# Patient Record
Sex: Female | Born: 1990 | Race: Black or African American | Hispanic: No | Marital: Single | Smoking: Never smoker
Health system: Southern US, Community
[De-identification: ages and names within clinical notes are randomized; demographics above are authoritative.]

## PROBLEM LIST (undated history)

## (undated) DIAGNOSIS — M1990 Unspecified osteoarthritis, unspecified site: Secondary | ICD-10-CM

## (undated) DIAGNOSIS — D649 Anemia, unspecified: Secondary | ICD-10-CM

## (undated) DIAGNOSIS — M459 Ankylosing spondylitis of unspecified sites in spine: Secondary | ICD-10-CM

## (undated) HISTORY — DX: Ankylosing spondylitis of unspecified sites in spine: M45.9

## (undated) HISTORY — DX: Anemia, unspecified: D64.9

## (undated) HISTORY — PX: GLAUCOMA SURGERY: SHX656

---

## 2016-01-07 ENCOUNTER — Emergency Department: Admit: 2016-01-07 | Payer: PRIVATE HEALTH INSURANCE | Primary: Family Medicine

## 2016-01-07 ENCOUNTER — Inpatient Hospital Stay
Admit: 2016-01-07 | Discharge: 2016-01-07 | Disposition: A | Discharge status: Home / Self Care | Payer: PRIVATE HEALTH INSURANCE | Attending: Emergency Medicine

## 2016-01-07 DIAGNOSIS — S348XXA Injury of other nerves at abdomen, lower back and pelvis level, initial encounter: Secondary | ICD-10-CM

## 2016-01-07 LAB — METABOLIC PANEL, COMPREHENSIVE
A-G Ratio: 0.9 — ABNORMAL LOW (ref 1.0–3.1)
ALT (SGPT): 29 U/L (ref 12.0–78.0)
AST (SGOT): 17 U/L (ref 15–37)
Albumin: 3.6 g/dL (ref 3.40–5.00)
Alk. phosphatase: 34 U/L — ABNORMAL LOW (ref 46–116)
Anion gap: 11 mmol/L (ref 10–17)
BUN/Creatinine ratio: 18 (ref 6.0–20.0)
BUN: 16 MG/DL (ref 7–18)
Bilirubin, total: 0.7 MG/DL (ref 0.20–1.00)
CO2: 27 mmol/L (ref 21–32)
Calcium: 9 MG/DL (ref 8.5–10.1)
Chloride: 101 mmol/L (ref 98–107)
Creatinine: 0.9 MG/DL (ref 0.6–1.3)
GFR est AA: >60 mL/min/{1.73_m2} (ref >60)
GFR est non-AA: >60 mL/min/{1.73_m2} (ref >60)
Globulin: 3.9 g/dL
Glucose: 80 mg/dL (ref 74–106)
Potassium: 3.9 mmol/L (ref 3.50–5.10)
Protein, total: 7.5 g/dL (ref 6.40–8.20)
Sodium: 135 mmol/L — ABNORMAL LOW (ref 136–145)

## 2016-01-07 LAB — CBC WITH AUTOMATED DIFF
ABS. BASOPHILS: 0 10*3/uL (ref 0.0–0.2)
ABS. EOSINOPHILS: 0.1 10*3/uL (ref 0.0–0.7)
ABS. LYMPHOCYTES: 1.7 10*3/uL (ref 1.2–3.4)
ABS. MONOCYTES: 1.3 10*3/uL (ref 1.1–3.2)
ABS. NEUTROPHILS: 11.6 10*3/uL — ABNORMAL HIGH (ref 1.4–6.5)
BASOPHILS: 0 % (ref 0–2)
EOSINOPHILS: 1 % (ref 0–5)
HCT: 36.6 % (ref 34.0–42.2)
HGB: 12 g/dL (ref 11.8–14.2)
IMMATURE GRANULOCYTES: 1.1 % (ref 0.0–5.0)
LYMPHOCYTES: 12 % — ABNORMAL LOW (ref 16–40)
MCH: 26.6 PG — ABNORMAL LOW (ref 27–31)
MCHC: 32.8 g/dL (ref 32–36)
MCV: 81.2 FL (ref 81–99)
MONOCYTES: 9 % (ref 0–12)
MPV: 9.4 FL (ref 7.4–10.4)
NEUTROPHILS: 78 % — ABNORMAL HIGH (ref 40–70)
PLATELET: 193 10*3/uL (ref 140–450)
RBC: 4.51 M/uL (ref 4.0–5.2)
RDW: 17.9 % — ABNORMAL HIGH (ref 11.5–14.5)
WBC: 14.9 10*3/uL — ABNORMAL HIGH (ref 4.8–10.8)

## 2016-01-07 LAB — LACTIC ACID: Lactic acid: 2.3 MMOL/L — ABNORMAL HIGH (ref 0.4–2.0)

## 2016-01-07 LAB — HCG QL SERUM: HCG, Ql.: NEGATIVE

## 2016-01-07 LAB — ETHYL ALCOHOL: ALCOHOL(ETHYL),SERUM: <3 MG/DL (ref <3.0)

## 2016-01-07 MED ORDER — LIDOCAINE 5 % (700 MG/PATCH) ADHESIVE PATCH
5 % | CUTANEOUS | 0 refills | Status: AC
Start: 2016-01-07 — End: ?

## 2016-01-07 MED ORDER — IOPAMIDOL 61 % IV SOLN
300 mg iodine /mL (61 %) | Freq: Once | INTRAVENOUS | Status: AC
Start: 2016-01-07 — End: 2016-01-07
  Administered 2016-01-07: 11:00:00 via INTRAVENOUS

## 2016-01-07 MED ORDER — ONDANSETRON (PF) 4 MG/2 ML INJECTION
4 mg/2 mL | INTRAMUSCULAR | Status: AC
Start: 2016-01-07 — End: 2016-01-07
  Administered 2016-01-07: 16:00:00 via INTRAVENOUS

## 2016-01-07 MED ORDER — OXYCODONE ER 10 MG TABLET,CRUSH RESISTANT,EXTENDED RELEASE 12 HR
10 mg | Freq: Once | ORAL | Status: AC
Start: 2016-01-07 — End: 2016-01-07
  Administered 2016-01-07: 14:00:00 via ORAL

## 2016-01-07 MED ORDER — DEXAMETHASONE SODIUM PHOSPHATE 10 MG/ML IJ SOLN
10 mg/mL | INTRAMUSCULAR | Status: AC
Start: 2016-01-07 — End: 2016-01-07
  Administered 2016-01-07: 14:00:00 via INTRAVENOUS

## 2016-01-07 MED ORDER — KETOROLAC TROMETHAMINE 30 MG/ML INJECTION
30 mg/mL (1 mL) | INTRAMUSCULAR | Status: AC
Start: 2016-01-07 — End: 2016-01-07
  Administered 2016-01-07: 12:00:00 via INTRAVENOUS

## 2016-01-07 MED ORDER — IBUPROFEN 800 MG TAB
800 mg | ORAL_TABLET | Freq: Three times a day (TID) | ORAL | 0 refills | Status: AC | PRN
Start: 2016-01-07 — End: 2016-01-14

## 2016-01-07 MED ORDER — SODIUM CHLORIDE 0.9% BOLUS IV
0.9 % | INTRAVENOUS | Status: AC
Start: 2016-01-07 — End: 2016-01-07
  Administered 2016-01-07: 16:00:00 via INTRAVENOUS

## 2016-01-07 MED ORDER — LIDOCAINE 5 % (700 MG/PATCH) ADHESIVE PATCH
5 % | CUTANEOUS | Status: DC
Start: 2016-01-07 — End: 2016-01-07

## 2016-01-07 MED ORDER — CYCLOBENZAPRINE 10 MG TAB
10 mg | ORAL | Status: AC
Start: 2016-01-07 — End: 2016-01-07
  Administered 2016-01-07: 14:00:00 via ORAL

## 2016-01-07 MED ORDER — MORPHINE 4 MG/ML SYRINGE
4 mg/mL | INTRAMUSCULAR | Status: DC | PRN
Start: 2016-01-07 — End: 2016-01-07

## 2016-01-07 MED ORDER — DIAZEPAM 5 MG/ML SYRINGE
5 mg/mL | INTRAMUSCULAR | Status: AC
Start: 2016-01-07 — End: 2016-01-07
  Administered 2016-01-07: 12:00:00 via INTRAVENOUS

## 2016-01-07 MED ORDER — SODIUM CHLORIDE 0.9% BOLUS IV
0.9 % | Freq: Once | INTRAVENOUS | Status: AC
Start: 2016-01-07 — End: 2016-01-07
  Administered 2016-01-07: 10:00:00 via INTRAVENOUS

## 2016-01-07 MED ORDER — CYCLOBENZAPRINE 10 MG TAB
10 mg | ORAL_TABLET | Freq: Two times a day (BID) | ORAL | 0 refills | Status: AC
Start: 2016-01-07 — End: ?

## 2016-01-07 MED ORDER — MORPHINE 4 MG/ML SYRINGE
4 mg/mL | Freq: Once | INTRAMUSCULAR | Status: AC
Start: 2016-01-07 — End: 2016-01-07
  Administered 2016-01-07: 12:00:00 via INTRAVENOUS

## 2016-01-07 MED ORDER — MORPHINE 4 MG/ML SYRINGE
4 mg/mL | Freq: Once | INTRAMUSCULAR | Status: AC
Start: 2016-01-07 — End: 2016-01-07
  Administered 2016-01-07: 10:00:00 via INTRAVENOUS

## 2016-01-07 MED ORDER — MIDAZOLAM 1 MG/ML IJ SOLN
1 mg/mL | INTRAMUSCULAR | Status: AC
Start: 2016-01-07 — End: 2016-01-07
  Administered 2016-01-07: 10:00:00 via INTRAVENOUS

## 2016-01-07 MED ORDER — ONDANSETRON 4 MG TAB, RAPID DISSOLVE
4 mg | ORAL_TABLET | Freq: Three times a day (TID) | ORAL | 0 refills | Status: AC | PRN
Start: 2016-01-07 — End: ?

## 2016-01-07 MED FILL — SODIUM CHLORIDE 0.9 % IV: INTRAVENOUS | Qty: 1000

## 2016-01-07 MED FILL — DIAZEPAM 5 MG/ML SYRINGE: 5 mg/mL | INTRAMUSCULAR | Qty: 2

## 2016-01-07 MED FILL — MIDAZOLAM 1 MG/ML IJ SOLN: 1 mg/mL | INTRAMUSCULAR | Qty: 2

## 2016-01-07 MED FILL — KETOROLAC TROMETHAMINE 30 MG/ML INJECTION: 30 mg/mL (1 mL) | INTRAMUSCULAR | Qty: 1

## 2016-01-07 MED FILL — CYCLOBENZAPRINE 10 MG TAB: 10 mg | ORAL | Qty: 1

## 2016-01-07 MED FILL — ISOVUE-300  61 % INTRAVENOUS SOLUTION: 300 mg iodine /mL (61 %) | INTRAVENOUS | Qty: 100

## 2016-01-07 MED FILL — LIDOCAINE 5 % (700 MG/PATCH) ADHESIVE PATCH: 5 % | CUTANEOUS | Qty: 1

## 2016-01-07 MED FILL — DEXAMETHASONE SODIUM PHOSPHATE 10 MG/ML IJ SOLN: 10 mg/mL | INTRAMUSCULAR | Qty: 1

## 2016-01-07 MED FILL — MORPHINE 4 MG/ML SYRINGE: 4 mg/mL | INTRAMUSCULAR | Qty: 2

## 2016-01-07 MED FILL — ONDANSETRON (PF) 4 MG/2 ML INJECTION: 4 mg/2 mL | INTRAMUSCULAR | Qty: 2

## 2016-01-07 MED FILL — OXYCONTIN 10 MG TABLET,CRUSH RESISTANT,EXTENDED RELEASE: 10 mg | ORAL | Qty: 1

## 2016-01-07 NOTE — ED Notes (Signed)
Called pharmacy for pain medication tablet.

## 2016-01-07 NOTE — ED Notes (Signed)
Bedside shift change report given to Diamond NickelLaura G. RN  (oncoming nurse) by ET (offgoing nurse). Report included the following information SBAR, ED Summary, MAR and Recent Results.

## 2016-01-07 NOTE — ED Notes (Signed)
Pt discharged home with instructions in hand and left with family memebers

## 2016-01-07 NOTE — ED Notes (Signed)
Called PT to alert to order.

## 2016-01-07 NOTE — ED Notes (Signed)
Assumed care of pt and pt tearful at this time and complaining of pain in her back. Dr Roxan Hockeyobinson made aware. Pt has a c-collar in place and awaiting CT results.
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
## 2016-01-07 NOTE — ED Notes (Shared)
7:39 AM  Pt is crying with continued back pain. Sensation intact.    8:58 AM  Pt is moving her legs. She states that the toradol has helped with the pain.    9:37 AM  Pt was given an oxycodone for her pain.    10:13 AM  Pt is tearful and crying. At this time pt C-collar was removed. Will attempt to set pt up with physical therapy. Pt also to be given decadron and a lidoderm patch.    11:51 AM  The pt is throwing up. I will write her for zofran.    12:01 PM  Pt still complains that her legs are weak. I will order an MRI of her spine.    12:49 PM  Pt was able to tolerate crackers. Pt also able to stand and move to chair. Will continue to monitor pt.    1:10 PM  Pt continuing to be tolerating PO. She will be discharged.

## 2020-07-23 ENCOUNTER — Encounter (HOSPITAL_COMMUNITY): Payer: Self-pay | Admitting: *Deleted

## 2020-07-23 ENCOUNTER — Emergency Department (HOSPITAL_COMMUNITY): Payer: Medicaid - Out of State

## 2020-07-23 ENCOUNTER — Emergency Department (HOSPITAL_COMMUNITY)
Admission: EM | Admit: 2020-07-23 | Discharge: 2020-07-23 | Disposition: A | Discharge status: Home / Self Care | Payer: Medicaid - Out of State | Attending: Emergency Medicine | Admitting: Emergency Medicine

## 2020-07-23 ENCOUNTER — Other Ambulatory Visit: Payer: Self-pay

## 2020-07-23 DIAGNOSIS — S0990XA Unspecified injury of head, initial encounter: Secondary | ICD-10-CM | POA: Diagnosis not present

## 2020-07-23 DIAGNOSIS — W08XXXA Fall from other furniture, initial encounter: Secondary | ICD-10-CM | POA: Diagnosis not present

## 2020-07-23 DIAGNOSIS — F17200 Nicotine dependence, unspecified, uncomplicated: Secondary | ICD-10-CM | POA: Insufficient documentation

## 2020-07-23 DIAGNOSIS — S4991XA Unspecified injury of right shoulder and upper arm, initial encounter: Secondary | ICD-10-CM | POA: Insufficient documentation

## 2020-07-23 DIAGNOSIS — S3991XA Unspecified injury of abdomen, initial encounter: Secondary | ICD-10-CM | POA: Diagnosis not present

## 2020-07-23 DIAGNOSIS — W19XXXA Unspecified fall, initial encounter: Secondary | ICD-10-CM

## 2020-07-23 HISTORY — DX: Unspecified osteoarthritis, unspecified site: M19.90

## 2020-07-23 LAB — CBC
HCT: 42 % (ref 36.0–46.0)
Hemoglobin: 13.6 g/dL (ref 12.0–15.0)
MCH: 30 pg (ref 26.0–34.0)
MCHC: 32.4 g/dL (ref 30.0–36.0)
MCV: 92.5 fL (ref 80.0–100.0)
Platelets: 187 10*3/uL (ref 150–400)
RBC: 4.54 MIL/uL (ref 3.87–5.11)
RDW: 14.3 % (ref 11.5–15.5)
WBC: 13 10*3/uL — ABNORMAL HIGH (ref 4.0–10.5)
nRBC: 0 % (ref 0.0–0.2)

## 2020-07-23 LAB — COMPREHENSIVE METABOLIC PANEL
ALT: 39 U/L (ref 0–44)
AST: 36 U/L (ref 15–41)
Albumin: 4.1 g/dL (ref 3.5–5.0)
Alkaline Phosphatase: 47 U/L (ref 38–126)
Anion gap: 12 (ref 5–15)
BUN: 12 mg/dL (ref 6–20)
CO2: 19 mmol/L — ABNORMAL LOW (ref 22–32)
Calcium: 9.6 mg/dL (ref 8.9–10.3)
Chloride: 107 mmol/L (ref 98–111)
Creatinine, Ser: 0.87 mg/dL (ref 0.44–1.00)
GFR, Estimated: >60 mL/min (ref >60)
Glucose, Bld: 96 mg/dL (ref 70–99)
Potassium: 4 mmol/L (ref 3.5–5.1)
Sodium: 138 mmol/L (ref 135–145)
Total Bilirubin: 0.9 mg/dL (ref 0.3–1.2)
Total Protein: 7.4 g/dL (ref 6.5–8.1)

## 2020-07-23 LAB — PROTIME-INR
INR: 1.1 (ref 0.8–1.2)
Prothrombin Time: 13.7 seconds (ref 11.4–15.2)

## 2020-07-23 LAB — ETHANOL: Alcohol, Ethyl (B): <10 mg/dL (ref <10)

## 2020-07-23 LAB — LACTIC ACID, PLASMA: Lactic Acid, Venous: 1.7 mmol/L (ref 0.5–1.9)

## 2020-07-23 LAB — I-STAT BETA HCG BLOOD, ED (MC, WL, AP ONLY): I-stat hCG, quantitative: <5 m[IU]/mL (ref <5)

## 2020-07-23 MED ORDER — SODIUM CHLORIDE 0.9 % IV BOLUS
1000.0000 mL | Freq: Once | INTRAVENOUS | Status: AC
  Administered 2020-07-23: 1000 mL via INTRAVENOUS

## 2020-07-23 MED ORDER — IOHEXOL 300 MG/ML  SOLN
100.0000 mL | Freq: Once | INTRAMUSCULAR | Status: AC | PRN
  Administered 2020-07-23: 100 mL via INTRAVENOUS

## 2020-07-23 MED ORDER — ACETAMINOPHEN 325 MG PO TABS
650.0000 mg | ORAL_TABLET | Freq: Once | ORAL | Status: AC
  Administered 2020-07-23: 650 mg via ORAL
  Filled 2020-07-23: qty 2

## 2020-07-23 MED ORDER — SODIUM CHLORIDE 0.9 % IV SOLN: INTRAVENOUS | Status: DC

## 2020-07-23 MED ORDER — FENTANYL CITRATE (PF) 100 MCG/2ML IJ SOLN
50.0000 ug | Freq: Once | INTRAMUSCULAR | Status: AC
  Administered 2020-07-23: 50 ug via INTRAVENOUS
  Filled 2020-07-23: qty 2
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
## 2020-07-23 NOTE — ED Notes (Signed)
Long spine board removed per Dr. Jacqulyn Bath.
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
## 2020-11-16 ENCOUNTER — Other Ambulatory Visit: Payer: Self-pay

## 2020-11-16 ENCOUNTER — Emergency Department
Admission: EM | Admit: 2020-11-16 | Discharge: 2020-11-16 | Disposition: A | Discharge status: Home / Self Care | Payer: Medicaid - Out of State | Attending: Emergency Medicine | Admitting: Emergency Medicine

## 2020-11-16 ENCOUNTER — Encounter: Payer: Self-pay | Admitting: Emergency Medicine

## 2020-11-16 DIAGNOSIS — N760 Acute vaginitis: Secondary | ICD-10-CM

## 2020-11-16 DIAGNOSIS — N898 Other specified noninflammatory disorders of vagina: Secondary | ICD-10-CM | POA: Diagnosis present

## 2020-11-16 LAB — URINALYSIS, COMPLETE (UACMP) WITH MICROSCOPIC
Bacteria, UA: NONE SEEN
Bilirubin Urine: NEGATIVE
Glucose, UA: NEGATIVE mg/dL
Hgb urine dipstick: NEGATIVE
Ketones, ur: NEGATIVE mg/dL
Nitrite: NEGATIVE
Protein, ur: NEGATIVE mg/dL
Specific Gravity, Urine: 1.021 (ref 1.005–1.030)
pH: 5 (ref 5.0–8.0)

## 2020-11-16 LAB — POC URINE PREG, ED: Preg Test, Ur: NEGATIVE

## 2020-11-16 LAB — WET PREP, GENITAL
Clue Cells Wet Prep HPF POC: NONE SEEN
Sperm: NONE SEEN
Trich, Wet Prep: NONE SEEN
Yeast Wet Prep HPF POC: NONE SEEN

## 2020-11-16 LAB — CHLAMYDIA/NGC RT PCR (ARMC ONLY)
Chlamydia Tr: NOT DETECTED
N gonorrhoeae: NOT DETECTED

## 2020-11-16 MED ORDER — FLUCONAZOLE 150 MG PO TABS: 150.0000 mg | ORAL_TABLET | Freq: Every day | ORAL | 0 refills | Status: DC

## 2020-11-16 NOTE — ED Notes (Signed)
See triage note  Presents with some vaginal itching and some discomfort  Sx's started couple of days ago

## 2020-11-16 NOTE — ED Notes (Signed)
Pelvic cart set up in room. Patient disrobed waist down.

## 2020-11-16 NOTE — Discharge Instructions (Addendum)
Take Diflucan, 1 tablet if not improved in 48 hours.  Follow-up with OB/GYN as soon as possible.

## 2020-11-16 NOTE — ED Triage Notes (Signed)
Pt in with vaginal pain and itching x 4 days. States increased white discharge as well. No bleeding reported. No urinary symptoms at this time

## 2020-11-16 NOTE — ED Notes (Signed)
Provided DC instructions. Verbalized understanding.  

## 2020-11-16 NOTE — ED Provider Notes (Signed)
[facility] Emergency Department Provider Note  ____________________________________________   None    (approximate)  I have reviewed the triage vital signs and the nursing notes.   HISTORY  Chief Complaint Vaginal Pain  HPI Bethany Li is a 30 y.o. female reports to the emergency department for evaluation of vaginal pain and itching for the last 4 days.  She states that she has a white discharge that is stable for her, is not new.  States that she does have history of BV, though this was more often prior to birth of her child over 3 years ago.  She has not had an episode of BV since that time.  She denies concern for STD, has monogamous partner of 5 years.  Patient denies any new feminine products, douching or other contact, however she does state that she has been soaking in scented Epson salt baths as well as using wet flushable wipes more frequently recently.        Past Medical History:  Diagnosis Date  . Arthritis     There are no problems to display for this patient.   History reviewed. No pertinent surgical history.  Prior to Admission medications   Medication Sig Start Date End Date Taking? Authorizing Provider  fluconazole (DIFLUCAN) 150 MG tablet Take 1 tablet (150 mg total) by mouth daily. 11/16/20  Yes Lucy Chris, PA    Allergies Patient has no allergy information on record.  No family history on file.  Social History Social History   Tobacco Use  . Smoking status: Current Some Day Smoker  . Smokeless tobacco: Never Used  Vaping Use  . Vaping Use: Every day  Substance Use Topics  . Alcohol use: Yes  . Drug use: Not Currently    Review of Systems Constitutional: No fever/chills Eyes: No visual changes. ENT: No sore throat. Cardiovascular: Denies chest pain. Respiratory: Denies shortness of breath. Gastrointestinal: No abdominal pain.  No nausea, no vomiting.  No diarrhea.  No constipation. Genitourinary:  + Vaginal itching, negative for dysuria. Musculoskeletal: Negative for back pain. Skin: Negative for rash. Neurological: Negative for headaches, focal weakness or numbness.  ____________________________________________   PHYSICAL EXAM:  VITAL SIGNS: ED Triage Vitals  Enc Vitals Group     BP 11/16/20 1359 131/73     Pulse Rate 11/16/20 1359 97     Resp 11/16/20 1359 18     Temp 11/16/20 1359 98 F (36.7 C)     Temp Source 11/16/20 1359 Oral     SpO2 11/16/20 1359 100 %     Weight 11/16/20 1400 132 lb 4.4 oz (60 kg)     Height --      Head Circumference --      Peak Flow --      Pain Score --      Pain Loc --      Pain Edu? --      Excl. in GC? --    Constitutional: Alert and oriented. Well appearing and in no acute distress. Eyes: Conjunctivae are normal. PERRL. EOMI. Head: Atraumatic. Nose: No congestion/rhinnorhea. Mouth/Throat: Mucous membranes are moist.   Neck: No stridor.   Cardiovascular: Normal rate, regular rhythm.   Respiratory: Normal respiratory effort.  No retractions. Lungs CTAB. Gastrointestinal: Soft and nontender. No distention. No abdominal bruits. No CVA tenderness. Genitourinary: External female genitalia appears normal, no raised lesions.  Site of most discomfort for the patient is at the inferior pole of the introitus, no irregularity seen  in this region.  Remaining pelvic exam is normal.  No adnexal tenderness.  Discharge does not have abnormal color or odor, appears physiologic. Musculoskeletal: No lower extremity tenderness nor edema.  No joint effusions. Neurologic:  Normal speech and language. No gross focal neurologic deficits are appreciated. No gait instability. Skin:  Skin is warm, dry and intact. No rash noted. Psychiatric: Mood and affect are normal. Speech and behavior are normal.  ____________________________________________   LABS (all labs ordered are listed, but only abnormal results are displayed)  Labs Reviewed  WET PREP,  GENITAL - Abnormal; Notable for the following components:      Result Value   WBC, Wet Prep HPF POC RARE (*)    All other components within normal limits  URINALYSIS, COMPLETE (UACMP) WITH MICROSCOPIC - Abnormal; Notable for the following components:   Color, Urine YELLOW (*)    APPearance HAZY (*)    Leukocytes,Ua SMALL (*)    All other components within normal limits  CHLAMYDIA/NGC RT PCR ([facility] ONLY)  POC URINE PREG, ED   ____________________________________________   INITIAL IMPRESSION / ASSESSMENT AND PLAN / ED COURSE  As part of my medical decision making, I reviewed the following data within the electronic MEDICAL RECORD NUMBER Nursing notes reviewed and incorporated, Labs reviewed and Notes from prior ED visits        Patient is a 30 year old female who presents to the emergency department for evaluation of itchiness in the vaginal region that began 3 days ago, see HPI for further details.  In triage, the patient has normal vital signs.  On physical exam, there is no abnormality noted.  No tenderness on pelvic exam.  Specific location of abnormal sensation does not appear abnormal.  Evaluation continue with urinalysis, U pregnant, GC chlamydia and wet prep.  There is a small number of leukocytes present along with a small amount of white blood cells present on the wet prep.  No trichomonas, yeast, clue cells, gonorrhea or chlamydia.  Patient is not pregnant.  At this time, exam and labs are consistent with vaginitis of unknown etiology.  Low suspicion for infectious etiology at this time.  Recommended patient discontinue any products including soaking in baths with since.  If symptoms do not improve in 3 days, did prescribe the patient 1 dose of Diflucan indicates that this is yeast related.  Patient encouraged to have close follow-up with OB/GYN for further evaluation of vaginitis.  Patient is amenable with this plan she stable at time for outpatient therapy.       ____________________________________________   FINAL CLINICAL IMPRESSION(S) / ED DIAGNOSES  Final diagnoses:  Acute vaginitis     ED Discharge Orders         Ordered    fluconazole (DIFLUCAN) 150 MG tablet  Daily        11/16/20 1608          *Please note:  Bethany Li was evaluated in Emergency Department on 11/16/2020 for the symptoms described in the history of present illness. She was evaluated in the context of the global COVID-19 pandemic, which necessitated consideration that the patient might be at risk for infection with the SARS-CoV-2 virus that causes COVID-19. Institutional protocols and algorithms that pertain to the evaluation of patients at risk for COVID-19 are in a state of rapid change based on information released by regulatory bodies including the CDC and federal and state organizations. These policies and algorithms were followed during the patient's care in the ED.  Some  ED evaluations and interventions may be delayed as a result of limited staffing during and the pandemic.*   Note:  This document was prepared using Dragon voice recognition software and may include unintentional dictation errors.   Lucy Chris, PA 11/16/20 2030    Chesley Noon, MD 11/16/20 [phone]

## 2021-01-10 DIAGNOSIS — Z20822 Contact with and (suspected) exposure to covid-19: Secondary | ICD-10-CM | POA: Insufficient documentation

## 2021-01-10 DIAGNOSIS — F17200 Nicotine dependence, unspecified, uncomplicated: Secondary | ICD-10-CM | POA: Insufficient documentation

## 2021-01-10 DIAGNOSIS — H6993 Unspecified Eustachian tube disorder, bilateral: Secondary | ICD-10-CM | POA: Insufficient documentation

## 2021-01-10 DIAGNOSIS — R0981 Nasal congestion: Secondary | ICD-10-CM | POA: Diagnosis present

## 2021-01-10 DIAGNOSIS — J019 Acute sinusitis, unspecified: Secondary | ICD-10-CM | POA: Insufficient documentation

## 2021-01-10 DIAGNOSIS — H73893 Other specified disorders of tympanic membrane, bilateral: Secondary | ICD-10-CM | POA: Insufficient documentation

## 2021-01-11 ENCOUNTER — Emergency Department
Admission: EM | Admit: 2021-01-11 | Discharge: 2021-01-11 | Disposition: A | Discharge status: Home / Self Care | Payer: Medicaid - Out of State | Attending: Emergency Medicine | Admitting: Emergency Medicine

## 2021-01-11 DIAGNOSIS — J019 Acute sinusitis, unspecified: Secondary | ICD-10-CM

## 2021-01-11 DIAGNOSIS — H6983 Other specified disorders of Eustachian tube, bilateral: Secondary | ICD-10-CM

## 2021-01-11 LAB — SARS CORONAVIRUS 2 (TAT 6-24 HRS): SARS Coronavirus 2: NEGATIVE

## 2021-01-11 MED ORDER — METHYLPREDNISOLONE 4 MG PO TBPK: ORAL_TABLET | ORAL | 0 refills | Status: DC

## 2021-01-11 MED ORDER — PREDNISONE 20 MG PO TABS
30.0000 mg | ORAL_TABLET | Freq: Once | ORAL | Status: AC
  Administered 2021-01-11: 30 mg via ORAL
  Filled 2021-01-11: qty 1

## 2021-01-11 MED ORDER — AZITHROMYCIN 500 MG PO TABS
500.0000 mg | ORAL_TABLET | Freq: Once | ORAL | Status: AC
  Administered 2021-01-11: 500 mg via ORAL
  Filled 2021-01-11: qty 1

## 2021-01-11 MED ORDER — AZITHROMYCIN 250 MG PO TABS: 250.0000 mg | ORAL_TABLET | Freq: Every day | ORAL | 0 refills | Status: DC

## 2021-01-11 NOTE — ED Notes (Signed)
Pt updated on wait. Warm blanket provided.

## 2021-01-11 NOTE — ED Provider Notes (Incomplete)
[facility] Emergency Department Provider Note   ____________________________________________   Event Date/Time   First MD Initiated Contact with Patient 01/11/21 0255     (approximate)  I have reviewed the triage vital signs and the nursing notes.   HISTORY  Chief Complaint Nasal Congestion    HPI Bethany Li is a 30 y.o. female ***        {**SYMPTOM/COMPLAINT  LOCATION (describe anatomically) DURATION (when did it start) TIMING (onset and pattern) SEVERITY (0-10, mild/moderate/severe) QUALITY (description of symptoms) CONTEXT (recent surgery, new meds, activity, etc.) MODIFYINGFACTORS (what makes it better/worse) ASSOCIATEDSYMPTOMS (pertinent positives and negatives)**} Past Medical History:  Diagnosis Date  . Arthritis     There are no problems to display for this patient.   History reviewed. No pertinent surgical history.  Prior to Admission medications   Medication Sig Start Date End Date Taking? Authorizing Provider  fluconazole (DIFLUCAN) 150 MG tablet Take 1 tablet (150 mg total) by mouth daily. 11/16/20   Lucy Chris, PA    Allergies Patient has no known allergies.  History reviewed. No pertinent family history.  Social History Social History   Tobacco Use  . Smoking status: Current Some Day Smoker  . Smokeless tobacco: Never Used  . Tobacco comment: Hooka  Vaping Use  . Vaping Use: Every day  Substance Use Topics  . Alcohol use: Yes  . Drug use: Not Currently    Review of Systems {** Revise as appropriate then delete this line - Documentation of 10 systems is required  **} Constitutional: No fever/chills Eyes: No visual changes. ENT: No sore throat. Cardiovascular: Denies chest pain. Respiratory: Denies shortness of breath. Gastrointestinal: No abdominal pain.  No nausea, no vomiting.  No diarrhea.  No constipation. Genitourinary: Negative for dysuria. Musculoskeletal: Negative for back  pain. Skin: Negative for rash. Neurological: Negative for headaches, focal weakness or numbness. {**Psychiatric:  Endocrine:  Hematological/Lymphatic:  Allergic/Immunilogical: **}  ____________________________________________   PHYSICAL EXAM:  VITAL SIGNS: ED Triage Vitals  Enc Vitals Group     BP 01/11/21 0005 101/82     Pulse Rate 01/11/21 0004 85     Resp 01/11/21 0004 18     Temp 01/11/21 0004 98.6 F (37 C)     Temp Source 01/11/21 0004 Oral     SpO2 01/11/21 0004 100 %     Weight 01/11/21 0005 121 lb (54.9 kg)     Height 01/11/21 0005 5\' 9"  (1.753 m)     Head Circumference --      Peak Flow --      Pain Score 01/11/21 0005 8     Pain Loc --      Pain Edu? --      Excl. in GC? --    {** Revise as appropriate then delete this line - 8 systems required **} Constitutional: Alert and oriented. Well appearing and in no acute distress. Eyes: Conjunctivae are normal. PERRL. EOMI. Head: Atraumatic. Nose: No congestion/rhinnorhea. Mouth/Throat: Mucous membranes are moist.  Oropharynx non-erythematous. Neck: No stridor.  {**No cervical spine tenderness to palpation.**} {**Hematological/Lymphatic/Immunilogical: No cervical lymphadenopathy. **}Cardiovascular: Normal rate, regular rhythm. Grossly normal heart sounds.  Good peripheral circulation. Respiratory: Normal respiratory effort.  No retractions. Lungs CTAB. Gastrointestinal: Soft and nontender. No distention. No abdominal bruits. No CVA tenderness. {**Genitourinary:  **}Musculoskeletal: No lower extremity tenderness nor edema.  No joint effusions. Neurologic:  Normal speech and language. No gross focal neurologic deficits are appreciated. No gait instability. Skin:  Skin is  warm, dry and intact. No rash noted. Psychiatric: Mood and affect are normal. Speech and behavior are normal.  ____________________________________________   LABS (all labs ordered are listed, but only abnormal results are displayed)  Labs  Reviewed - No data to display ____________________________________________  EKG  *** ____________________________________________  RADIOLOGY I, SUNG,JADE J, personally viewed and evaluated these images (plain radiographs) as part of my medical decision making, as well as reviewing the written report by the radiologist.  ED MD interpretation:  ***  Official radiology report(s): No results found.  ____________________________________________   PROCEDURES  Procedure(s) performed (including Critical Care):  Procedures   ____________________________________________   INITIAL IMPRESSION / ASSESSMENT AND PLAN / ED COURSE  As part of my medical decision making, I reviewed the following data within the electronic MEDICAL RECORD NUMBER {[id number]::"Notes from prior ED visits","[place] Controlled Substance Database"}        ***      ____________________________________________   FINAL CLINICAL IMPRESSION(S) / ED DIAGNOSES  Final diagnoses:  None     ED Discharge Orders    None      *Please note:  Bethany Li was evaluated in Emergency Department on 01/11/2021 for the symptoms described in the history of present illness. She was evaluated in the context of the global COVID-19 pandemic, which necessitated consideration that the patient might be at risk for infection with the SARS-CoV-2 virus that causes COVID-19. Institutional protocols and algorithms that pertain to the evaluation of patients at risk for COVID-19 are in a state of rapid change based on information released by regulatory bodies including the CDC and federal and state organizations. These policies and algorithms were followed during the patient's care in the ED.  Some ED evaluations and interventions may be delayed as a result of limited staffing during and the pandemic.*   Note:  This document was prepared using Dragon voice recognition software and may include unintentional dictation errors.

## 2021-01-11 NOTE — ED Provider Notes (Signed)
[facility] Emergency Department Provider Note   ____________________________________________   Event Date/Time   First MD Initiated Contact with Patient 01/11/21 0255     (approximate)  I have reviewed the triage vital signs and the nursing notes.   HISTORY  Chief Complaint Nasal Congestion    HPI Meosha Olenick is a 30 y.o. female who presents to the ED from home with a chief complaint of sinus pressure and nasal congestion x3 weeks.  Has tried several OTC medications without relief of symptoms.  Presents with sinus pressure, ears clogged and facial pain from sinuses.  Denies fever, chills, cough, shortness of breath, nausea, vomiting or dizziness     Past Medical History:  Diagnosis Date  . Arthritis     There are no problems to display for this patient.   History reviewed. No pertinent surgical history.  Prior to Admission medications   Medication Sig Start Date End Date Taking? Authorizing Provider  azithromycin (ZITHROMAX) 250 MG tablet Take 1 tablet (250 mg total) by mouth daily. 01/11/21  Yes Irean Hong, MD  methylPREDNISolone (MEDROL DOSEPAK) 4 MG TBPK tablet Take as directed 01/11/21  Yes Irean Hong, MD  fluconazole (DIFLUCAN) 150 MG tablet Take 1 tablet (150 mg total) by mouth daily. 11/16/20   Lucy Chris, PA    Allergies Patient has no known allergies.  History reviewed. No pertinent family history.  Social History Social History   Tobacco Use  . Smoking status: Current Some Day Smoker  . Smokeless tobacco: Never Used  . Tobacco comment: Hooka  Vaping Use  . Vaping Use: Every day  Substance Use Topics  . Alcohol use: Yes  . Drug use: Not Currently    Review of Systems  Constitutional: No fever/chills Eyes: No visual changes. ENT: Positive for sinus pressure and nasal congestion. No sore throat. Cardiovascular: Denies chest pain. Respiratory: Denies shortness of breath. Gastrointestinal: No abdominal  pain.  No nausea, no vomiting.  No diarrhea.  No constipation. Genitourinary: Negative for dysuria. Musculoskeletal: Negative for back pain. Skin: Negative for rash. Neurological: Negative for headaches, focal weakness or numbness.   ____________________________________________   PHYSICAL EXAM:  VITAL SIGNS: ED Triage Vitals  Enc Vitals Group     BP 01/11/21 0005 101/82     Pulse Rate 01/11/21 0004 85     Resp 01/11/21 0004 18     Temp 01/11/21 0004 98.6 F (37 C)     Temp Source 01/11/21 0004 Oral     SpO2 01/11/21 0004 100 %     Weight 01/11/21 0005 121 lb (54.9 kg)     Height 01/11/21 0005 5\' 9"  (1.753 m)     Head Circumference --      Peak Flow --      Pain Score 01/11/21 0005 8     Pain Loc --      Pain Edu? --      Excl. in GC? --     Constitutional: Alert and oriented. Well appearing and in no acute distress. Eyes: Conjunctivae are normal. PERRL. EOMI. Head: Atraumatic.  Frontal and maxillary sinuses tender to palpation. Ears: Mild fluid behind bilateral TMs. Nose: Congestion/rhinnorhea. Mouth/Throat: Mucous membranes are moist.  Oropharynx non-erythematous.  Postnasal drip. Neck: No stridor.  Supple neck without meningismus. Cardiovascular: Normal rate, regular rhythm. Grossly normal heart sounds.  Good peripheral circulation. Respiratory: Normal respiratory effort.  No retractions. Lungs CTAB. Gastrointestinal: Soft and nontender. No distention. No abdominal bruits. No CVA tenderness.  Musculoskeletal: No lower extremity tenderness nor edema.  No joint effusions. Neurologic:  Normal speech and language. No gross focal neurologic deficits are appreciated. No gait instability. Skin:  Skin is warm, dry and intact. No rash noted.  No petechiae. Psychiatric: Mood and affect are normal. Speech and behavior are normal.  ____________________________________________   LABS (all labs ordered are listed, but only abnormal results are displayed)  Labs Reviewed  SARS  CORONAVIRUS 2 (TAT 6-24 HRS)   ____________________________________________  EKG  None ____________________________________________  RADIOLOGY I, Lachandra Dettmann J, personally viewed and evaluated these images (plain radiographs) as part of my medical decision making, as well as reviewing the written report by the radiologist.  ED MD interpretation: None  Official radiology report(s): No results found.  ____________________________________________   PROCEDURES  Procedure(s) performed (including Critical Care):  Procedures   ____________________________________________   INITIAL IMPRESSION / ASSESSMENT AND PLAN / ED COURSE  As part of my medical decision making, I reviewed the following data within the electronic MEDICAL RECORD NUMBER Nursing notes reviewed and incorporated, Old chart reviewed and Notes from prior ED visits     30 year old female presenting with sinus pressure and nasal congestion x3 weeks.  Will start Medrol Dosepak for eustachian tube dysfunction, Z-Pak for mild sinusitis.  Will obtain send out COVID swab.  Strict return precautions given.  Patient verbalizes understanding agrees with plan of care.      ____________________________________________   FINAL CLINICAL IMPRESSION(S) / ED DIAGNOSES  Final diagnoses:  Acute sinusitis, recurrence not specified, unspecified location  Eustachian tube dysfunction, bilateral     ED Discharge Orders         Ordered    methylPREDNISolone (MEDROL DOSEPAK) 4 MG TBPK tablet        01/11/21 0304    azithromycin (ZITHROMAX) 250 MG tablet  Daily        01/11/21 0304          *Please note:  Dawanda Mapel was evaluated in Emergency Department on 01/11/2021 for the symptoms described in the history of present illness. She was evaluated in the context of the global COVID-19 pandemic, which necessitated consideration that the patient might be at risk for infection with the SARS-CoV-2 virus that causes COVID-19.  Institutional protocols and algorithms that pertain to the evaluation of patients at risk for COVID-19 are in a state of rapid change based on information released by regulatory bodies including the CDC and federal and state organizations. These policies and algorithms were followed during the patient's care in the ED.  Some ED evaluations and interventions may be delayed as a result of limited staffing during and the pandemic.*   Note:  This document was prepared using Dragon voice recognition software and may include unintentional dictation errors.   Irean Hong, MD 01/11/21 [phone]

## 2021-01-11 NOTE — ED Triage Notes (Signed)
Pt reports sinus pressure and nasal congestion x3 weeks. Reports trying multiple OTC medications without relief. Denies fevers/SOB

## 2021-01-11 NOTE — Discharge Instructions (Addendum)
1.  Take steroid taper as prescribed (Medrol Dosepak). 2.  Take antibiotic as prescribed (Azithromycin 250 mg daily x4 days). 3.  You will be notified of any positive COVID results.

## 2021-04-16 NOTE — Progress Notes (Signed)
Office Visit Note  Patient: Bethany Li             Date of Birth: [birth year]           MRN: [id number]             PCP: Benito Mccreedy, MD Referring: Trey Sailors, [place] Visit Date: 04/30/2021 Occupation: @GUAROCC @  Subjective:  Lower back pain and Planter fasciitis.   History of Present Illness: Bethany Li is a 30 y.o. female seen in consultation per request of her PCP.  According to the patient her symptoms a started when she was in high school with lower back pain, heel pain and intermittent visual problems.  She was seen by an ophthalmologist for a while.  She states she was given oral prednisone and high doses and also eyedrops for the treatment of uveitis.  She stayed on prednisone tapers for many years.  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  She was a started on methotrexate by her ophthalmologist in 2019.  At the time prednisone was tapered.  She recalls being started on Remicade which she took for 1 year and then was switched to Humira for unknown reason.  She took Humira for 1 year but developed pustular psoriasis and Humira was discontinued.  On chart review it appears she was on Simponi infusions prior to starting [place].  She was started on Taltz last year and she has been tolerating it well.  She still has intermittent SI joint pain and Planter fasciitis.  She has not had a flare of uveitis in the last year.  She states because she moved from [place] to [place] she has had some interruption in therapy.  She had mild flare of uveitis recently.  She has been seen by [facility] ophthalmology.  And she is using prednisone eyedrops currently.  She continues to take Donnetta Hail on a regular basis.  He has not had a flare of plantar fasciitis in 1 year.  There is history of rheumatoid arthritis in her father who is also HLA-B27 positive.  She is gravida 1, para 1, miscarriages  0.  Activities of Daily Living:  Patient reports morning stiffness for 1 hour.   Patient Reports nocturnal pain.  Difficulty dressing/grooming: Denies Difficulty climbing stairs: Denies Difficulty getting out of chair: Denies Difficulty using hands for taps, buttons, cutlery, and/or writing: Denies  Review of Systems  Constitutional:  Negative for fatigue, night sweats, weight gain and weight loss.  HENT:  Negative for mouth sores, trouble swallowing, trouble swallowing, mouth dryness and nose dryness.   Eyes:  Negative for pain, redness, visual disturbance and dryness.  Respiratory:  Negative for cough, shortness of breath and difficulty breathing.   Cardiovascular:  Negative for chest pain, palpitations, hypertension, irregular heartbeat and swelling in legs/feet.  Gastrointestinal:  Negative for blood in stool, constipation and diarrhea.  Endocrine: Negative for increased urination.  Genitourinary:  Negative for vaginal dryness.  Musculoskeletal:  Positive for joint pain, joint pain and morning stiffness. Negative for joint swelling, myalgias, muscle weakness, muscle tenderness and myalgias.  Skin:  Negative for color change, rash, hair loss, skin tightness, ulcers and sensitivity to sunlight.  Allergic/Immunologic: Negative for susceptible to infections.  Neurological:  Negative for dizziness, memory loss, night sweats and weakness.  Hematological:  Negative for swollen glands.  Psychiatric/Behavioral:  Negative for depressed mood and sleep disturbance. The patient is not nervous/anxious.  PMFS History:  There are no problems to display for this patient.   Past Medical History:  Diagnosis Date   Arthritis     Family History  Problem Relation Age of Onset   Rheum arthritis Father    History reviewed. No pertinent surgical history. Social History   Social History Narrative   Not on file    There is no immunization history on file for this patient.   Objective: Vital  Signs: BP 112/69 (BP Location: Right Arm, Patient Position: Sitting, Cuff Size: Small)   Pulse 97   Resp 12   Ht 5' 5"  (1.651 m)   Wt 123 lb 12.8 oz (56.2 kg)   BMI 20.60 kg/m    Physical Exam Vitals and nursing note reviewed.  Constitutional:      Appearance: She is well-developed.  HENT:     Head: Normocephalic and atraumatic.  Eyes:     Conjunctiva/sclera: Conjunctivae normal.  Cardiovascular:     Rate and Rhythm: Normal rate and regular rhythm.     Heart sounds: Normal heart sounds.  Pulmonary:     Effort: Pulmonary effort is normal.     Breath sounds: Normal breath sounds.  Abdominal:     General: Bowel sounds are normal.     Palpations: Abdomen is soft.  Musculoskeletal:     Cervical back: Normal range of motion.  Lymphadenopathy:     Cervical: No cervical adenopathy.  Skin:    General: Skin is warm and dry.     Capillary Refill: Capillary refill takes less than 2 seconds.  Neurological:     Mental Status: She is alert and oriented to person, place, and time.  Psychiatric:        Behavior: Behavior normal.     Musculoskeletal Exam: C-spine thoracic and lumbar spine were in good range of motion.  She was able to reach her toes without difficulty.  She had some discomfort in the lower lumbar region and SI joints.  There was no point tenderness noted.  Shoulder joints, elbow joints, wrist joints, MCPs PIPs and DIPs with good range of motion with no synovitis.  Hip joints, knee joints, ankles, MTPs and PIPs with good range of motion with no synovitis.  She describes some discomfort on palpation of her heels.  CDAI Exam: CDAI Score: -- Patient Global: --; Provider Global: -- Swollen: --; Tender: -- Joint Exam 04/30/2021   No joint exam has been documented for this visit   There is currently no information documented on the homunculus. Go to the Rheumatology activity and complete the homunculus joint exam.  Investigation: No additional findings.  Imaging: No  results found.  Recent Labs: Lab Results  Component Value Date   WBC 13.0 (H) 07/23/2020   HGB 13.6 07/23/2020   PLT 187 07/23/2020   NA 138 07/23/2020   K 4.0 07/23/2020   CL 107 07/23/2020   CO2 19 (L) 07/23/2020   GLUCOSE 96 07/23/2020   BUN 12 07/23/2020   CREATININE 0.87 07/23/2020   BILITOT 0.9 07/23/2020   ALKPHOS 47 07/23/2020   AST 36 07/23/2020   ALT 39 07/23/2020   PROT 7.4 07/23/2020   ALBUMIN 4.1 07/23/2020   CALCIUM 9.6 07/23/2020    Speciality Comments: No specialty comments available.  Procedures:  No procedures performed Allergies: Patient has no known allergies.   Assessment / Plan:     Visit Diagnoses: Ankylosing spondylitis of multiple sites in spine [facility]) - 03/02/21: CBC WNL, ALT 36, rest of CMP  WNL, ESR 17, CRP 2. ANA negative on 02/16/21. -Patient was diagnosed with ankylosing spondylitis in 2019 although her symptoms started in 2011.  She was initially tried on methotrexate due to uveitis.  She had an adequate response to methotrexate.  According the patient she was on Remicade infusions for 1 year and then Humira for 1 year.  Humira was discontinued due to development of pustular psoriasis.  She was tried on Simponi infusions for 1 year and then was switched to [employer].  She has been on Taltz injections for the last 1 year.  She states has been working fairly well.  She has not had a flare of plantar fasciitis in 1 year.  She had mild uveitis flare because of interruption of treatment.  She moved from [place] to [place] last year.  She continues to have some off-and-on discomfort in her lower back and SI joints.  Plan: Sedimentation rate.  We plan to continue Taltz and methotrexate.  She is taking methotrexate 9 tabs per week.  I advised her to split dose of methotrexate 4 tablets on Wednesday and 9 tablets on Friday for better absorption.  I will also increase the dose of folic acid to 2 mg p.o. daily.  I discussed the benefit of subcutaneous methotrexate.  We  will apply for subcutaneous methotrexate.  If approved we will switch her to subcu methotrexate for better efficacy.  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  She has been using prednisone eyedrops for recent flare.  Chronic midline low back pain without sciatica -she continues to have some lower back pain.  Plan: XR Lumbar Spine 2-3 Views.  X-rays of lumbar spine were unremarkable.  Chronic SI joint pain -she gives history of intermittent SI joint pain.  Plan: XR Pelvis 1-2 Views.  X-rays of pelvis were unremarkable.  High risk medication use - Taltz 80 mg sq injections every 4 weeks, methotrexate 22.5 mg weekly - Plan: CBC with Differential/Platelet, COMPLETE METABOLIC PANEL WITH GFR, Hepatitis B core antibody, IgM, Hepatitis B surface antigen, Hepatitis C antibody, QuantiFERON-TB Gold Plus, Serum protein electrophoresis with reflex, IgG, IgA, IgM, HIV Antibody (routine testing w rflx)  Medication counseling:  Baseline Immunosuppressant Therapy Labs TB GOLD: Pending   Hepatitis Panel: Pending   HIV: Pending Chest Xray: July 23, 2020  Does patient have a history of inflammatory bowel disease? No  Counseled patient that Donnetta Hail is a IL-17 inhibitor that works to reduce pain and inflammation associated with arthritis.  Counseled patient on purpose, proper use, and adverse effects of Taltz. Reviewed the most common adverse effects of infection, inflammatory bowel disease, and allergic reaction. Counseled patient that Donnetta Hail should be held for infection and prior to scheduled surgery.  Counseled patient to avoid live vaccines while on Taltz.  Advised patient to get annual influenza vaccine, pneumococcal vaccine, and Shingrix as indicated.  Reviewed storage information for Taltz.  Reviewed the importance of regular labs while on [place]. Standing orders placed and is to return in 1 month and then every 3 months after initiation.  Provided patient with medication education  material and answered all questions.  Patient consented to [place].  Will upload consent into patient's chart.  Will apply for Taltz through patient's insurance and update when we receive a response.  Advised initial injection must be administered in office.  Patient voiced understanding.    Taltz dose will be: For ankylosing spondylitis load of 160 mg then 80 mg every 28 days  Prescription will be sent to pharmacy  pending lab results and insurance approval.   Drug Counseling TB Gold: Pending Hepatitis panel: Pending  Chest-xray: July 23, 2020  Contraception: Oral contraceptive pills  Alcohol use: occasional  Patient was counseled on the purpose, proper use, and adverse effects of methotrexate including nausea, infection, and signs and symptoms of pneumonitis.  Reviewed instructions with patient to take methotrexate weekly along with folic acid daily.  Discussed the importance of frequent monitoring of kidney and liver function and blood counts, and provided patient with standing lab instructions.  Counseled patient to avoid NSAIDs and alcohol while on methotrexate.  Provided patient with educational materials on methotrexate and answered all questions.  Advised patient to get annual influenza vaccine and to get a pneumococcal vaccine if patient has not already had one.  Patient voiced understanding.  Patient consented to methotrexate use.  Will upload into chart.      Long term (current) use of systemic steroids - prednisone 5 mg daily.  Patient was initially on prednisone for many years at high doses.  She states she has been on prednisone 5 mg p.o. daily.  She had been unable to taper prednisone.  Other fatigue - Plan: CK  Pustular psoriasis -she has no psoriasis lesions today.  Developed severe pustular psoriasis after 5 simponi aria infusions (last infusion Jan 2021) then switched to taltz  Orders: Orders Placed This Encounter  Procedures   XR Pelvis 1-2 Views   XR Lumbar Spine 2-3  Views   CBC with Differential/Platelet   COMPLETE METABOLIC PANEL WITH GFR   Sedimentation rate   CK   Hepatitis B core antibody, IgM   Hepatitis B surface antigen   Hepatitis C antibody   QuantiFERON-TB Gold Plus   Serum protein electrophoresis with reflex   IgG, IgA, IgM   HIV Antibody (routine testing w rflx)    No orders of the defined types were placed in this encounter.    Follow-Up Instructions: Return for AS.   Bo Merino, MD  Note - This record has been created using Editor, commissioning.  Chart creation errors have been sought, but may not always  have been located. Such creation errors do not reflect on  the standard of medical care.

## 2021-04-27 ENCOUNTER — Ambulatory Visit: Payer: Medicaid Other | Admitting: Diagnostic Neuroimaging

## 2021-04-30 ENCOUNTER — Ambulatory Visit: Payer: Medicaid Other | Admitting: Rheumatology

## 2021-04-30 ENCOUNTER — Encounter: Payer: Self-pay | Admitting: Neurology

## 2021-04-30 ENCOUNTER — Ambulatory Visit: Payer: Self-pay

## 2021-04-30 ENCOUNTER — Telehealth: Payer: Self-pay | Admitting: *Deleted

## 2021-04-30 ENCOUNTER — Encounter: Payer: Self-pay | Admitting: Rheumatology

## 2021-04-30 ENCOUNTER — Other Ambulatory Visit: Payer: Self-pay

## 2021-04-30 ENCOUNTER — Other Ambulatory Visit (HOSPITAL_COMMUNITY): Payer: Self-pay

## 2021-04-30 VITALS — BP 112/69 | HR 97 | Resp 12 | Ht 65.0 in | Wt 123.8 lb

## 2021-04-30 DIAGNOSIS — G8929 Other chronic pain: Secondary | ICD-10-CM | POA: Diagnosis not present

## 2021-04-30 DIAGNOSIS — H44113 Panuveitis, bilateral: Secondary | ICD-10-CM | POA: Diagnosis not present

## 2021-04-30 DIAGNOSIS — M545 Low back pain, unspecified: Secondary | ICD-10-CM

## 2021-04-30 DIAGNOSIS — M533 Sacrococcygeal disorders, not elsewhere classified: Secondary | ICD-10-CM

## 2021-04-30 DIAGNOSIS — M450 Ankylosing spondylitis of multiple sites in spine: Secondary | ICD-10-CM

## 2021-04-30 DIAGNOSIS — L401 Generalized pustular psoriasis: Secondary | ICD-10-CM

## 2021-04-30 DIAGNOSIS — R5383 Other fatigue: Secondary | ICD-10-CM

## 2021-04-30 DIAGNOSIS — Z7952 Long term (current) use of systemic steroids: Secondary | ICD-10-CM

## 2021-04-30 DIAGNOSIS — Z79899 Other long term (current) drug therapy: Secondary | ICD-10-CM

## 2021-04-30 MED ORDER — FOLIC ACID 1 MG PO TABS: 2.0000 mg | ORAL_TABLET | Freq: Every day | ORAL | 3 refills | Status: DC

## 2021-04-30 MED ORDER — PREDNISONE 5 MG PO TABS: 5.0000 mg | ORAL_TABLET | Freq: Every day | ORAL | 0 refills | Status: DC

## 2021-04-30 NOTE — Telephone Encounter (Addendum)
Submitted a FAXED Prior Authorization request to  [facility]  for TALTZ via CoverMyMeds. Will update once we receive a response.   KeyTerrill Li Phone# [phone]  Test claim for RASUVO revealed that insurance covers $1,581.24, leaving patient with a copay of $4.00 for 1.70mL as a 28 day supply. Prior auth not needed at this time.

## 2021-04-30 NOTE — Telephone Encounter (Addendum)
Please apply for Taltz and injectable Methotrexate per Dr.Deveshwar. Consent obtained and sent to scan place.

## 2021-04-30 NOTE — Patient Instructions (Addendum)
Ixekizumab injection What is this medication? IXEKIZUMAB (ix e KIZ ue mab) is used to treat plaque psoriasis, psoriatic arthritis, ankylosing spondylitis, and active non-radiographic axialspondyloarthritis. This medicine may be used for other purposes; ask your health care provider orpharmacist if you have questions. COMMON BRAND NAME(S): TALTZ What should I tell my care team before I take this medication? They need to know if you have any of these conditions: immune system problems infection (especially a viral infection such as chickenpox, cold sores, or herpes) recently received or are scheduled to receive a vaccine tuberculosis, a positive skin test for tuberculosis, or have recently been in close contact with someone who has tuberculosis an unusual or allergic reaction to ixekizumab, other medicines, foods, dyes or preservatives pregnant or trying to get pregnant breast-feeding How should I use this medication? This medicine is for injection under the skin. It may be administered by a healthcare professional in a hospital or clinic setting or at home. If you get this medicine at home, you will be taught how to prepare and give this medicine. Use exactly as directed. Take your medicine at regular intervals. Donot take your medicine more often than directed. It is important that you put your used needles and syringes in a special sharps container. Do not put them in a trash can. If you do not have a sharpscontainer, call your pharmacist or healthcare provider to get one. A special MedGuide will be given to you by the pharmacist with eachprescription and refill. Be sure to read this information carefully each time. Talk to your pediatrician regarding the use of this medicine in children. While this drug may be prescribed for children as young as 6 years for selectedconditions, precautions do apply. Overdosage: If you think you have taken too much of this medicine contact apoison control center  or emergency room at once. NOTE: This medicine is only for you. Do not share this medicine with others. What if I miss a dose? It is important not to miss your dose. Call your doctor of health care professional if you are unable to keep an appointment. If you give yourself the medicine and you miss a dose, take it as soon as you can. Then be sure to take your next doses on your regular schedule. Do not take double or extra doses. Ifyou have questions about a missed injection, call your health care professional. What may interact with this medication? Do not take this medicine with any of the following medications: live virus vaccines This medicine may also interact with the following medications: inactivated vaccines This list may not describe all possible interactions. Give your health care provider a list of all the medicines, herbs, non-prescription drugs, or dietary supplements you use. Also tell them if you smoke, drink alcohol, or use illegaldrugs. Some items may interact with your medicine. What should I watch for while using this medication? Tell your doctor or healthcare professional if your symptoms do not start toget better or if they get worse. You will be tested for tuberculosis (TB) before you start this medicine. If your doctor prescribes any medicine for TB, you should start taking the TB medicine before starting this medicine. Make sure to finish the full course ofTB medicine. Call your doctor or healthcare professional for advice if you get a fever, chills or sore throat, or other symptoms of a cold or flu. Do not treat yourself. This drug decreases your body's ability to fight infections. Try toavoid being around people who are sick. This  medicine can decrease the response to a vaccine. If you need to get vaccinated, tell your healthcare professional if you have received this medicine within the last 6 months. Extra booster doses may be needed. Talk toyour doctor to see if a  different vaccination schedule is needed. What side effects may I notice from receiving this medication? Side effects that you should report to your doctor or health care professionalas soon as possible: allergic reactions like skin rash, itching or hives, swelling of the face, lips, or tongue signs and symptoms of infection like fever or chills; cough; sore throat; pain or trouble passing urine signs and symptoms of bowel problems like abdominal pain, diarrhea, blood in the stool, and weight loss white patches in the mouth or throat vaginal discharge, itching, or odor in women Side effects that usually do not require medical attention (report to yourdoctor or health care professional if they continue or are bothersome): nausea runny nose sinus trouble This list may not describe all possible side effects. Call your doctor for medical advice about side effects. You may report side effects to FDA at1-800-FDA-1088. Where should I keep my medication? Keep out of the reach of children. Store the prefilled syringe or injection pen in a refrigerator between 2 to 8 degrees C (36 to 46 degrees F). Keep the syringe or the pen in the original carton until ready for use. Protect from light. Do not freeze. Do not shake. Prior to use, remove the syringe or pen from the refrigerator and use within 30minutes. Throw away any unused medicine after the expiration date on the label. NOTE: This sheet is a summary. It may not cover all possible information. If you have questions about this medicine, talk to your doctor, pharmacist, orhealth care provider.  2022 Elsevier/Gold Standard (2019-12-05 15:12:26) Vaccines You are taking a medication(s) that can suppress your immune system.  The following immunizations are recommended: Flu annually Covid-19  Td/Tdap (tetanus, diphtheria, pertussis) every 10 years Pneumonia (Prevnar 15 then Pneumovax 23 at least 1 year apart.  Alternatively, can take Prevnar 20 without  needing additional dose) Shingrix (after age 29): 2 doses from 4 weeks to 6 months apart  Please check with your PCP to make sure you are up to date.   If you test POSITIVE for COVID19 and have MILD to MODERATE symptoms: First, call your PCP if you would like to receive COVID19 treatment AND Hold your medications during the infection and for at least 1 week after your symptoms have resolved: Injectable medication (Benlysta, Cimzia, Cosentyx, Enbrel, Humira, Orencia, Remicade, Simponi, Stelara, Taltz, Tremfya) Methotrexate Leflunomide (Arava) Azathioprine Mycophenolate (Cellcept) Osborne OmanXeljanz, Olumiant, or Rinvoq Otezla If you take Actemra or Kevzara, you DO NOT need to hold these for COVID19 infection.  If you test POSITIVE for COVID19 and have NO symptoms: First, call your PCP if you would like to receive COVID19 treatment AND Hold your medications for at least 10 days after the day that you tested positive Injectable medication (Benlysta, Cimzia, Cosentyx, Enbrel, Humira, Orencia, Remicade, Simponi, Stelara, Taltz, Tremfya) Methotrexate Leflunomide (Arava) Azathioprine Mycophenolate (Cellcept) Osborne OmanXeljanz, Olumiant, or Rinvoq Otezla If you take Actemra or Kevzara, you DO NOT need to hold these for COVID19 infection.  If you have signs or symptoms of an infection or start antibiotics: First, call your PCP for workup of your infection. Hold your medication through the infection, until you complete your antibiotics, and until symptoms resolve if you take the following: Injectable medication (Actemra, Benlysta, Cimzia, Cosentyx, Enbrel, Humira, Kevzara,  Orencia, Remicade, Simponi, Stelara, Taltz, Tremfya) Methotrexate Leflunomide (Arava) Mycophenolate (Cellcept) Harriette Ohara, Olumiant, or Rinvoq     Methotrexate Injection What is this medication? METHOTREXATE (METH oh TREX ate) treats inflammatory conditions such as arthritis and psoriasis. It works by decreasing inflammation, which can  reduce pain and prevent long-term injury to the joints and skin. It may also be used to treat some types of cancer. It works by slowing down the growth of cancercells. This medicine may be used for other purposes; ask your health care provider orpharmacist if you have questions. What should I tell my care team before I take this medication? They need to know if you have any of these conditions: Fluid in the stomach area or lungs If you often drink alcohol Infection or immune system problems Kidney disease Liver disease Low blood counts (white cells, platelets, or red blood cells) Lung disease Recent or ongoing radiation Recent or upcoming vaccine Stomach ulcers Ulcerative colitis An unusual or allergic reaction to methotrexate, other medications, foods, dyes, or preservatives Pregnant or trying to get pregnant Breast-feeding How should I use this medication? This medication is for infusion into a vein or for injection into muscle or into the spinal fluid (whichever applies). It is usually given in a hospital orclinic setting. In rare cases, you might get this medication at home. You will be taught how to give this medication. Use exactly as directed. Take your medication at regularintervals. Do not take your medication more often than directed. If this medication is used for arthritis or psoriasis, it should be takenweekly, NOT daily. It is important that you put your used needles and syringes in a special sharps container. Do not put them in a trash can. If you do not have a sharpscontainer, call your pharmacist or care team to get one. Talk to your care team about the use of this medication in children. While this medication may be prescribed for children as young as 2 years for selectedconditions, precautions do apply. Overdosage: If you think you have taken too much of this medicine contact apoison control center or emergency room at once. NOTE: This medicine is only for you. Do not  share this medicine with others. What if I miss a dose? It is important not to miss your dose. Call your care team if you are unable to keep an appointment. If you give yourself the medication, and you miss a dose,talk with your care team. Do not take double or extra doses. What may interact with this medication? Do not take this medication with any of the following: Acitretin This medication may also interact with the following: Aspirin or aspirin-like medications including salicylates Azathioprine Certain antibiotics like chloramphenicol, penicillin, tetracycline Certain medications that treat or prevent blood clots like warfarin, apixaban, dabigatran, and rivaroxaban Certain medications for stomach problems like esomeprazole, omeprazole, pantoprazole Cyclosporine Dapsone Diuretics Folic acid Gold Hydroxychloroquine Live virus vaccines Medications for infection like acyclovir, adefovir, amphotericin B, bacitracin, cidofovir, foscarnet, ganciclovir, gentamicin, pentamidine, vancomycin Mercaptopurine NSAIDs, medications for pain and inflammation, like ibuprofen or naproxen Pamidronate Pemetrexed Penicillamine Phenylbutazone Phenytoin Probenecid Pyrimethamine Retinoids such as isotretinoin and tretinoin Steroid medications like prednisone or cortisone Sulfonamides like sulfasalazine and trimethoprim/sulfamethoxazole Theophylline Zoledronic acid This list may not describe all possible interactions. Give your health care provider a list of all the medicines, herbs, non-prescription drugs, or dietary supplements you use. Also tell them if you smoke, drink alcohol, or use illegaldrugs. Some items may interact with your medicine. What should I watch for while  using this medication? This medication may make you feel generally unwell. This is not uncommon as chemotherapy can affect healthy cells as well as cancer cells. Report any side effects. Continue your course of treatment even  though you feel ill unless yourcare team tells you to stop. Your condition will be monitored carefully while you are receiving thismedication. Avoid alcoholic drinks. This medication can cause serious side effects. To reduce the risk, your care team may give you other medications to take before receiving this one. Be sureto follow the directions from your care team. This medication can make you more sensitive to the sun. Keep out of the sun. If you cannot avoid being in the sun, wear protective clothing and use sunscreen.Do not use sun lamps or tanning beds/booths. You may get drowsy or dizzy. Do not drive, use machinery, or do anything that needs mental alertness until you know how this medication affects you. Do not stand or sit up quickly, especially if you are an older patient. This reducesthe risk of dizzy or fainting spells. You may need blood work while you are taking this medication. Call your care team for advice if you get a fever, chills or sore throat, or other symptoms of a cold or flu. Do not treat yourself. This medication decreases your body's ability to fight infections. Try to avoid being aroundpeople who are sick. This medication may increase your risk to bruise or bleed. Call your care teamif you notice any unusual bleeding. Be careful brushing or flossing your teeth or using a toothpick because you may get an infection or bleed more easily. If you have any dental work done, Estate agent you are receiving this medication Check with your care team if you get an attack of severe diarrhea, nausea and vomiting, or if you sweat a lot. The loss of too much body fluid can make itdangerous for you to take this medication. Talk to your care team about your risk of cancer. You may be more at risk forcertain types of cancers if you take this medication. Do not become pregnant while taking this medication or for 6 months after stopping it. Women should inform their care team if they wish to  become pregnant or think they might be pregnant. Men should not father a child while taking this medication and for 3 months after stopping it. There is potential for serious harm to an unborn child. Talk to your care team for more information. Do not breast-feed an infant while taking this medication or for 1week after stopping it. This medication may make it more difficult to get pregnant or father a child.Talk to your care team if you are concerned about your fertility. What side effects may I notice from receiving this medication? Side effects that you should report to your care team as soon as possible: Allergic reactions-skin rash, itching, hives, swelling of the face, lips, tongue, or throat Blood clot-pain, swelling, or warmth in the leg, shortness of breath, chest pain Dry cough, shortness of breath or trouble breathing Infection-fever, chills, cough, sore throat, wounds that don't heal, pain or trouble when passing urine, general feeling of discomfort or being unwell Kidney injury-decrease in the amount of urine, swelling of the ankles, hands, or feet Liver injury-right upper belly pain, loss of appetite, nausea, light-colored stool, dark yellow or brown urine, yellowing of the skin or eyes, unusual weakness or fatigue Low red blood cell count-unusual weakness or fatigue, dizziness, headache, trouble breathing Redness, blistering, peeling, or loosening of the  skin, including inside the mouth Seizures Unusual bruising or bleeding Side effects that usually do not require medical attention (report to your careteam if they continue or are bothersome): Diarrhea Dizziness Hair loss Nausea Pain, redness, or swelling with sores inside the mouth or throat Vomiting This list may not describe all possible side effects. Call your doctor for medical advice about side effects. You may report side effects to FDA at1-800-FDA-1088. Where should I keep my medication? This medication is given in a  hospital or clinic. It will not be stored at home. NOTE: This sheet is a summary. It may not cover all possible information. If you have questions about this medicine, talk to your doctor, pharmacist, orhealth care provider.  2022 Elsevier/Gold Standard (2020-10-19 12:51:29)

## 2021-04-30 NOTE — Telephone Encounter (Signed)
Please start Taltz BIV (this is continuation of therapy since she is transitoing care to [facility]).  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

## 2021-05-04 ENCOUNTER — Other Ambulatory Visit (HOSPITAL_COMMUNITY): Payer: Self-pay

## 2021-05-04 NOTE — Telephone Encounter (Signed)
Received notification from  [employer]  regarding a prior authorization for TALTZ. 80mg  Authorization has been APPROVED from 04/30/21 to 04/30/22.   Patient must fill through [facility]: [phone]   Authorization # [facility] Phone # [phone]  Ran test claim, copay was $4.00 for a 1 or 3 month supply.

## 2021-05-04 NOTE — Progress Notes (Signed)
All the labs are within normal limits.  I will discuss results at the follow-up visit.

## 2021-05-05 LAB — HEPATITIS C ANTIBODY
Hepatitis C Ab: NONREACTIVE
SIGNAL TO CUT-OFF: 0.04 (ref <1.00)

## 2021-05-05 LAB — PROTEIN ELECTROPHORESIS, SERUM, WITH REFLEX
Albumin ELP: 4.2 g/dL (ref 3.8–4.8)
Alpha 1: 0.3 g/dL (ref 0.2–0.3)
Alpha 2: 0.7 g/dL (ref 0.5–0.9)
Beta 2: 0.4 g/dL (ref 0.2–0.5)
Beta Globulin: 0.5 g/dL (ref 0.4–0.6)
Gamma Globulin: 1.2 g/dL (ref 0.8–1.7)
Total Protein: 7.3 g/dL (ref 6.1–8.1)

## 2021-05-05 LAB — COMPLETE METABOLIC PANEL WITH GFR
AG Ratio: 1.3 (calc) (ref 1.0–2.5)
ALT: 27 U/L (ref 6–29)
AST: 28 U/L (ref 10–30)
Albumin: 4 g/dL (ref 3.6–5.1)
Alkaline phosphatase (APISO): 57 U/L (ref 31–125)
BUN: 12 mg/dL (ref 7–25)
CO2: 24 mmol/L (ref 20–32)
Calcium: 9.3 mg/dL (ref 8.6–10.2)
Chloride: 106 mmol/L (ref 98–110)
Creat: 0.78 mg/dL (ref 0.50–0.96)
Globulin: 3 g/dL (calc) (ref 1.9–3.7)
Glucose, Bld: 74 mg/dL (ref 65–99)
Potassium: 4.1 mmol/L (ref 3.5–5.3)
Sodium: 140 mmol/L (ref 135–146)
Total Bilirubin: 0.5 mg/dL (ref 0.2–1.2)
Total Protein: 7 g/dL (ref 6.1–8.1)
eGFR: 105 mL/min/{1.73_m2} (ref >=60)

## 2021-05-05 LAB — CBC WITH DIFFERENTIAL/PLATELET
Absolute Monocytes: 585 cells/uL (ref 200–950)
Basophils Absolute: 50 cells/uL (ref 0–200)
Basophils Relative: 1 %
Eosinophils Absolute: 210 cells/uL (ref 15–500)
Eosinophils Relative: 4.2 %
HCT: 39.3 % (ref 35.0–45.0)
Hemoglobin: 12.8 g/dL (ref 11.7–15.5)
Lymphs Abs: 1080 cells/uL (ref 850–3900)
MCH: 28.6 pg (ref 27.0–33.0)
MCHC: 32.6 g/dL (ref 32.0–36.0)
MCV: 87.7 fL (ref 80.0–100.0)
MPV: 9.9 fL (ref 7.5–12.5)
Monocytes Relative: 11.7 %
Neutro Abs: 3075 cells/uL (ref 1500–7800)
Neutrophils Relative %: 61.5 %
Platelets: 207 10*3/uL (ref 140–400)
RBC: 4.48 10*6/uL (ref 3.80–5.10)
RDW: 13.1 % (ref 11.0–15.0)
Total Lymphocyte: 21.6 %
WBC: 5 10*3/uL (ref 3.8–10.8)

## 2021-05-05 LAB — QUANTIFERON-TB GOLD PLUS
Mitogen-NIL: 4.87 IU/mL
NIL: 0.14 IU/mL
QuantiFERON-TB Gold Plus: NEGATIVE
TB1-NIL: <0 IU/mL
TB2-NIL: <0 IU/mL

## 2021-05-05 LAB — IGG, IGA, IGM
IgG (Immunoglobin G), Serum: 1244 mg/dL (ref 600–1640)
IgM, Serum: 197 mg/dL (ref 50–300)
Immunoglobulin A: 390 mg/dL — ABNORMAL HIGH (ref 47–310)

## 2021-05-05 LAB — HIV ANTIBODY (ROUTINE TESTING W REFLEX): HIV 1&2 Ab, 4th Generation: NONREACTIVE

## 2021-05-05 LAB — HEPATITIS B CORE ANTIBODY, IGM: Hep B C IgM: NONREACTIVE

## 2021-05-05 LAB — HEPATITIS B SURFACE ANTIGEN: Hepatitis B Surface Ag: NONREACTIVE

## 2021-05-05 LAB — CK: Total CK: 46 U/L (ref 29–143)

## 2021-05-05 LAB — SEDIMENTATION RATE: Sed Rate: 6 mm/h (ref 0–20)

## 2021-05-05 NOTE — Telephone Encounter (Signed)
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  If its more than 3 months then she will need loading dose and will have to come in the office to restart on Taltz.

## 2021-05-05 NOTE — Telephone Encounter (Signed)
ATC patient to review Taltz. Unsure how long she has had interruption in [place]. If >3 months, would have to reload and require new start visit. Also wanted to review Rasuvo which was approved through her insurance.  Phone rang once and went to busy tone thereafter. Will continue to f/u  Chesley Mires, PharmD, MPH, BCPS Clinical Pharmacist (Rheumatology and Pulmonology)

## 2021-05-06 ENCOUNTER — Other Ambulatory Visit (HOSPITAL_COMMUNITY): Payer: Self-pay

## 2021-05-06 MED ORDER — TALTZ 80 MG/ML ~~LOC~~ SOAJ
80.0000 mg | SUBCUTANEOUS | 0 refills | Status: DC
  Filled 2021-05-06: qty 3, 84d supply, fill #0
  Filled 2021-05-06: qty 3, fill #0

## 2021-05-06 MED ORDER — RASUVO 20 MG/0.4ML ~~LOC~~ SOAJ
20.0000 mg | SUBCUTANEOUS | 0 refills | Status: DC
  Filled 2021-05-06: qty 4.8, fill #0
  Filled 2021-05-06: qty 4.8, 84d supply, fill #0

## 2021-05-06 NOTE — Addendum Note (Signed)
Addended by: Murrell Redden on: 05/06/2021 12:11 PM   Modules accepted: Orders

## 2021-05-06 NOTE — Telephone Encounter (Signed)
Called patient to review how long she has been off of Taltz. She states she last took Taltz on 04/25/21 and is due on 05/23/21 for her next dose. States that she was off of therapy for 2 doses only when she had lapse in therapy. She has been advised of $8 copay.  Rx for Rasuvo and Taltz sent to [facility]. She has not previously taken Rasuvo before. She has been advised that Rasuvo does not need to stored in refrigerator. She states she has not taken methotrexate for several weeks.  Patient provided payment information that has been communicated to [facility].  Chesley Mires, PharmD, MPH, BCPS Clinical Pharmacist (Rheumatology and Pulmonology)

## 2021-05-06 NOTE — Telephone Encounter (Signed)
Medications scheduled to deliver to patient on 8/16

## 2021-05-06 NOTE — Addendum Note (Signed)
Addended by: Murrell Redden on: 05/06/2021 12:39 PM   Modules accepted: Orders

## 2021-05-07 ENCOUNTER — Other Ambulatory Visit (HOSPITAL_COMMUNITY): Payer: Self-pay

## 2021-05-21 NOTE — Progress Notes (Addendum)
Office Visit Note  Patient: Bethany Li             Date of Birth: 1990/12/16           MRN: [id number]             PCP: Benito Mccreedy, MD Referring: Benito Mccreedy, MD Visit Date: 06/02/2021 Occupation: @GUAROCC @  Subjective:  Lower back pain.   History of Present Illness: Bethany Li is a 30 y.o. female with history of ankylosing spondylitis and panuveitis.  She states she has not had a flare of uveitis since July.  Although she still have floaters.  She states she has been also having headaches.  She has an appointment coming up with a neurologist.  She states that she has been taking ibuprofen for headaches.  She also stated that she forgets to take her oral contraceptives on a regular basis.  Activities of Daily Living:  Patient reports morning stiffness for several hours.   Patient Reports nocturnal pain.  Difficulty dressing/grooming: Denies Difficulty climbing stairs: Denies Difficulty getting out of chair: Denies Difficulty using hands for taps, buttons, cutlery, and/or writing: Reports  Review of Systems  Constitutional:  Positive for fatigue.  HENT:  Negative for mouth sores, mouth dryness and nose dryness.   Eyes:  Positive for pain and visual disturbance. Negative for redness, itching and dryness.  Respiratory:  Positive for shortness of breath. Negative for difficulty breathing.   Cardiovascular:  Negative for chest pain and palpitations.  Gastrointestinal:  Negative for blood in stool, constipation and diarrhea.  Endocrine: Negative for increased urination.  Genitourinary:  Negative for difficulty urinating.  Musculoskeletal:  Positive for joint pain, joint pain and morning stiffness. Negative for joint swelling, myalgias, muscle tenderness and myalgias.  Skin:  Negative for color change, rash and redness.  Allergic/Immunologic: Positive for susceptible to infections.  Neurological:  Positive for numbness and headaches. Negative for  dizziness and memory loss.  Hematological:  Positive for bruising/bleeding tendency.  Psychiatric/Behavioral:  Negative for confusion.    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 No pertinent surgical history. Social History   Social History Narrative   Not on file    There is no immunization history on file for this patient.   Objective: Vital Signs: BP 125/81 (BP Location: Right Arm, Patient Position: Sitting, Cuff Size: Normal)   Pulse 75   Ht 5' 7"  (1.702 m)   Wt 127 lb (57.6 kg)   BMI 19.89 kg/m    Physical Exam Vitals and nursing note reviewed.  Constitutional:      Appearance: She is well-developed.  HENT:     Head: Normocephalic and atraumatic.  Eyes:     Conjunctiva/sclera: Conjunctivae normal.  Cardiovascular:     Rate and Rhythm: Normal rate and regular rhythm.     Heart sounds: Normal heart sounds.  Pulmonary:     Effort: Pulmonary effort is normal.     Breath sounds: Normal breath sounds.  Abdominal:     General: Bowel sounds are normal.     Palpations: Abdomen is soft.  Musculoskeletal:  Cervical back: Normal range of motion.  Lymphadenopathy:     Cervical: No cervical adenopathy.  Skin:    General: Skin is warm and dry.     Capillary Refill: Capillary refill takes less than 2 seconds.  Neurological:     Mental Status: She is alert and oriented to person, place, and time.  Psychiatric:        Behavior: Behavior normal.     Musculoskeletal Exam: Spine thoracic and lumbar spine with good range of motion.  She had discomfort range of motion of her lumbar spine.  She had tenderness on palpation  of her SI joints.  Shoulder joints, elbow joints, wrist joints, MCPs PIPs and DIPs with good range of motion with no synovitis.  Hip joints, knee joints, ankles, MTPs and PIPs with good range of motion with no synovitis.  CDAI Exam: CDAI Score: -- Patient Global: --; Provider Global: -- Swollen: --; Tender: -- Joint Exam 06/02/2021   No joint exam has been documented for this visit   There is currently no information documented on the homunculus. Go to the Rheumatology activity and complete the homunculus joint exam.  Investigation: No additional findings.  Imaging: No results found.  Recent Labs: Lab Results  Component Value Date   WBC 5.0 04/30/2021   HGB 12.8 04/30/2021   PLT 207 04/30/2021   NA 140 04/30/2021   K 4.1 04/30/2021   CL 106 04/30/2021   CO2 24 04/30/2021   GLUCOSE 74 04/30/2021   BUN 12 04/30/2021   CREATININE 0.78 04/30/2021   BILITOT 0.5 04/30/2021   ALKPHOS 47 07/23/2020   AST 28 04/30/2021   ALT 27 04/30/2021   PROT 7.0 04/30/2021   PROT 7.3 04/30/2021   ALBUMIN 4.1 07/23/2020   CALCIUM 9.3 04/30/2021   QFTBGOLDPLUS NEGATIVE 04/30/2021   April 30, 2021 SPEP normal, TB Gold negative, IgA positive, hepatitis B-, hepatitis C negative, HIV negative, ESR 6, CK 46 03/02/21: CBC WNL, ALT 36, rest of CMP WNL, ESR 17, CRP 2 02/16/21 ANA negative  Speciality Comments: Treated with Remicade, Humira and Simponi per patient.    Appointment every 3 months  Procedures:  No procedures performed Allergies: Patient has no known allergies.   Assessment / Plan:     Visit Diagnoses: Ankylosing spondylitis of multiple sites in spine [facility] - Diagnosed 2019 in [place].  She had an inadequate response to Remicade, Humira and Simponi in the past.  She had been on Taltz since 2021.  She has been also on methotrexate.  At the last visit she was switched to subcu methotrexate.  She is a still on prednisone 5 mg p.o. daily.  Which she has been on for a long time.  We  discussed tapering prednisone at the next visit.  Panuveitis of both eyes - Followed by [facility] ophthalmology.  She uses prednisone eyedrops as needed flares.  Last flare was in July 2022.  Patient states she still have floaters.  Have advised her to follow-up with the ophthalmologist to see if she has active disease.  If she does not have any active uveitis then we will plan on tapering prednisone at the follow-up visit.  High risk medication use - Taltz 80 mg sq injections every 4 weeks, methotrexate 20 mg weekly.  H/o an inadequate response to Remicade and Humira.Simponi-she developed psoriasis on Simponi. - Plan: COMPLETE METABOLIC PANEL WITH GFR today and then she will have CBC with differential and CMP with GFR every 3 months.  She has been  advised to stop his medications in case develops an infection restarts once infection resolves.  Updated information about immunization was also given.  She was also advised not to take ibuprofen on a regular basis as she is on high-dose methotrexate.  Using contraception on a regular basis was emphasized while she is on methotrexate.  Patient has been noncompliant with the contraception.  Long term (current) use of systemic steroids - She has been on prednisone 5 mg daily and even higher doses for many years.  She has been unable to taper prednisone in the past.  We discussed tapering prednisone at the follow-up visit by 1 mg every 2 months if possible.  Chronic midline low back pain without sciatica - X-rays were unremarkable.  X-ray findings were discussed with the patient.  She continues to have lower back pain.  She has a 67-year-old child and she states taking care of the child causes increased discomfort in her lower back.  Chronic SI joint pain - History of intermittent SI joint pain.  X-rays were unremarkable.  X-ray findings were discussed with the patient.  She was referred to physical therapy.  Chronic non-intractable headache-patient states she  has been getting frequent headaches.  She has an appointment coming up with the neurologist.  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  She had no lesions of psoriasis at the last visit.  Orders: Orders Placed This Encounter  Procedures   COMPLETE METABOLIC PANEL WITH GFR   No orders of the defined types were placed in this encounter.    Follow-Up Instructions: Return in about 3 months (around 09/01/2021) for Ankylosing spondylitis.   Bo Merino, MD  Note - This record has been created using Editor, commissioning.  Chart creation errors have been sought, but may not always  have been located. Such creation errors do not reflect on  the standard of medical care.

## 2021-06-02 ENCOUNTER — Encounter: Payer: Self-pay | Admitting: Rheumatology

## 2021-06-02 ENCOUNTER — Ambulatory Visit (INDEPENDENT_AMBULATORY_CARE_PROVIDER_SITE_OTHER): Payer: Medicaid Other | Admitting: Rheumatology

## 2021-06-02 ENCOUNTER — Other Ambulatory Visit: Payer: Self-pay

## 2021-06-02 VITALS — BP 125/81 | HR 75 | Ht 67.0 in | Wt 127.0 lb

## 2021-06-02 DIAGNOSIS — M545 Low back pain, unspecified: Secondary | ICD-10-CM

## 2021-06-02 DIAGNOSIS — H44113 Panuveitis, bilateral: Secondary | ICD-10-CM | POA: Diagnosis not present

## 2021-06-02 DIAGNOSIS — R519 Headache, unspecified: Secondary | ICD-10-CM

## 2021-06-02 DIAGNOSIS — M450 Ankylosing spondylitis of multiple sites in spine: Secondary | ICD-10-CM | POA: Diagnosis not present

## 2021-06-02 DIAGNOSIS — Z7952 Long term (current) use of systemic steroids: Secondary | ICD-10-CM

## 2021-06-02 DIAGNOSIS — L401 Generalized pustular psoriasis: Secondary | ICD-10-CM | POA: Insufficient documentation

## 2021-06-02 DIAGNOSIS — Z79899 Other long term (current) drug therapy: Secondary | ICD-10-CM | POA: Insufficient documentation

## 2021-06-02 DIAGNOSIS — G8929 Other chronic pain: Secondary | ICD-10-CM

## 2021-06-02 DIAGNOSIS — M533 Sacrococcygeal disorders, not elsewhere classified: Secondary | ICD-10-CM

## 2021-06-02 NOTE — Patient Instructions (Addendum)
Standing Labs We placed an order today for your standing lab work.   Please have your standing labs drawn in December and every 3 months  If possible, please have your labs drawn 2 weeks prior to your appointment so that the provider can discuss your results at your appointment.  Please note that you may see your imaging and lab results in MyChart before we have reviewed them. We may be awaiting multiple results to interpret others before contacting you. Please allow our office up to 72 hours to thoroughly review all of the results before contacting the office for clarification of your results.  We have open lab daily: Monday through Thursday from 1:30-4:30 PM and Friday from 1:30-4:00 PM at the office of Dr. Pollyann Savoy, [facility].   Please be advised, all patients with office appointments requiring lab work will take precedent over walk-in lab work.  If possible, please come for your lab work on Monday and Friday afternoons, as you may experience shorter wait times. The office is located at [address], [place], [place] [address] No appointment is necessary.   Labs are drawn by [facility]. Please bring your co-pay at the time of your lab draw.  You may receive a bill from [facility] for your lab work.  If you wish to have your labs drawn at another location, please call the office 24 hours in advance to send orders.  If you have any questions regarding directions or hours of operation,  please call [phone].   As a reminder, please drink plenty of water prior to coming for your lab work. Thanks!   COVID-19 vaccine recommendations:   COVID-19 vaccine is recommended for everyone (unless you are allergic to a vaccine component), even if you are on a medication that suppresses your immune system.   If you are on Methotrexate, Cellcept (mycophenolate), Rinvoq, Harriette Ohara, and Olumiant- hold the medication for 1 week after each vaccine. Hold Methotrexate for 2 weeks  after the single dose COVID-19 vaccine.   If you are on Orencia subcutaneous injection - hold medication one week prior to and one week after the first COVID-19 vaccine dose (only).   If you are on Orencia IV infusions- time vaccination administration so that the first COVID-19 vaccination will occur four weeks after the infusion and postpone the subsequent infusion by one week.   If you are on Cyclophosphamide or Rituxan infusions please contact your doctor prior to receiving the COVID-19 vaccine.   Do not take Tylenol or any anti-inflammatory medications (NSAIDs) 24 hours prior to the COVID-19 vaccination.   There is no direct evidence about the efficacy of the COVID-19 vaccine in individuals who are on medications that suppress the immune system.   Even if you are fully vaccinated, and you are on any medications that suppress your immune system, please continue to wear a mask, maintain at least six feet social distance and practice hand hygiene.   If you develop a COVID-19 infection, please contact your PCP or our office to determine if you need monoclonal antibody infusion.  The booster vaccine is now available for immunocompromised patients.   Please see the following web sites for updated information.   [email or web address]-[email or web address]  Vaccines You are taking a medication(s) that can suppress your immune system.  The following immunizations are recommended: Flu annually Covid-19  Td/Tdap (tetanus, diphtheria, pertussis) every 10 years Pneumonia (Prevnar 15 then Pneumovax 23 at least 1 year apart.  Alternatively, can take Prevnar 20 without needing additional  dose) Shingrix: 2 doses from 4 weeks to 6 months apart  Please check with your PCP to make sure you are up to date.   If you test POSITIVE for COVID19 and have MILD to MODERATE symptoms: First, call your PCP if you would like to receive COVID19 treatment AND Hold your  medications during the infection and for at least 1 week after your symptoms have resolved: Injectable medication (Benlysta, Cimzia, Cosentyx, Enbrel, Humira, Orencia, Remicade, Simponi, Stelara, Taltz, Tremfya) Methotrexate Leflunomide (Arava) Azathioprine Mycophenolate (Cellcept) Osborne Oman, or Rinvoq Otezla If you take Actemra or Kevzara, you DO NOT need to hold these for COVID19 infection.  If you test POSITIVE for COVID19 and have NO symptoms: First, call your PCP if you would like to receive COVID19 treatment AND Hold your medications for at least 10 days after the day that you tested positive Injectable medication (Benlysta, Cimzia, Cosentyx, Enbrel, Humira, Orencia, Remicade, Simponi, Stelara, Taltz, Tremfya) Methotrexate Leflunomide (Arava) Azathioprine Mycophenolate (Cellcept) Osborne Oman, or Rinvoq Otezla If you take Actemra or Kevzara, you DO NOT need to hold these for COVID19 infection.  If you have signs or symptoms of an infection or start antibiotics: First, call your PCP for workup of your infection. Hold your medication through the infection, until you complete your antibiotics, and until symptoms resolve if you take the following: Injectable medication (Actemra, Benlysta, Cimzia, Cosentyx, Enbrel, Humira, Kevzara, Orencia, Remicade, Simponi, Stelara, Taltz, Tremfya) Methotrexate Leflunomide (Arava) Mycophenolate (Cellcept) Harriette Ohara, Olumiant, or Rinvoq   Back Exercises The following exercises strengthen the muscles that help to support the trunk (torso) and back. They also help to keep the lower back flexible. Doing these exercises can help to prevent or lessen existing low back pain. If you have back pain or discomfort, try doing these exercises 2-3 times each day or as told by your health care provider. As your pain improves, do them once each day, but increase the number of times that you repeat the steps for each exercise (do more repetitions). To  prevent the recurrence of back pain, continue to do these exercises once each day or as told by your health care provider. Do exercises exactly as told by your health care provider and adjust them as directed. It is normal to feel mild stretching, pulling, tightness, or discomfort as you do these exercises, but you should stop right away if you feel sudden pain or your pain gets worse. Exercises Single knee to chest Repeat these steps 3-5 times for each leg: Lie on your back on a firm bed or the floor with your legs extended. Bring one knee to your chest. Your other leg should stay extended and in contact with the floor. Hold your knee in place by grabbing your knee or thigh with both hands and hold. Pull on your knee until you feel a gentle stretch in your lower back or buttocks. Hold the stretch for 10-30 seconds. Slowly release and straighten your leg. Pelvic tilt Repeat these steps 5-10 times: Lie on your back on a firm bed or the floor with your legs extended. Bend your knees so they are pointing toward the ceiling and your feet are flat on the floor. Tighten your lower abdominal muscles to press your lower back against the floor. This motion will tilt your pelvis so your tailbone points up toward the ceiling instead of pointing to your feet or the floor. With gentle tension and even breathing, hold this position for 5-10 seconds. Cat-cow Repeat these steps until your  lower back becomes more flexible: Get into a hands-and-knees position on a firm bed or the floor. Keep your hands under your shoulders, and keep your knees under your hips. You may place padding under your knees for comfort. Let your head hang down toward your chest. Contract your abdominal muscles and point your tailbone toward the floor so your lower back becomes rounded like the back of a cat. Hold this position for 5 seconds. Slowly lift your head, let your abdominal muscles relax, and point your tailbone up toward the  ceiling so your back forms a sagging arch like the back of a cow. Hold this position for 5 seconds.  Press-ups Repeat these steps 5-10 times: Lie on your abdomen (face-down) on a firm bed or the floor. Place your palms near your head, about shoulder-width apart. Keeping your back as relaxed as possible and keeping your hips on the floor, slowly straighten your arms to raise the top half of your body and lift your shoulders. Do not use your back muscles to raise your upper torso. You may adjust the placement of your hands to make yourself more comfortable. Hold this position for 5 seconds while you keep your back relaxed. Slowly return to lying flat on the floor.  Bridges Repeat these steps 10 times: Lie on your back on a firm bed or the floor. Bend your knees so they are pointing toward the ceiling and your feet are flat on the floor. Your arms should be flat at your sides, next to your body. Tighten your buttocks muscles and lift your buttocks off the floor until your waist is at almost the same height as your knees. You should feel the muscles working in your buttocks and the back of your thighs. If you do not feel these muscles, slide your feet 1-2 inches (2.5-5 cm) farther away from your buttocks. Hold this position for 3-5 seconds. Slowly lower your hips to the starting position, and allow your buttocks muscles to relax completely. If this exercise is too easy, try doing it with your arms crossed over your chest. Abdominal crunches Repeat these steps 5-10 times: Lie on your back on a firm bed or the floor with your legs extended. Bend your knees so they are pointing toward the ceiling and your feet are flat on the floor. Cross your arms over your chest. Tip your chin slightly toward your chest without bending your neck. Tighten your abdominal muscles and slowly raise your torso high enough to lift your shoulder blades a tiny bit off the floor. Avoid raising your torso higher than that  because it can put too much stress on your lower back and does not help to strengthen your abdominal muscles. Slowly return to your starting position. Back lifts Repeat these steps 5-10 times: Lie on your abdomen (face-down) with your arms at your sides, and rest your forehead on the floor. Tighten the muscles in your legs and your buttocks. Slowly lift your chest off the floor while you keep your hips pressed to the floor. Keep the back of your head in line with the curve in your back. Your eyes should be looking at the floor. Hold this position for 3-5 seconds. Slowly return to your starting position. Contact a health care provider if: Your back pain or discomfort gets much worse when you do an exercise. Your worsening back pain or discomfort does not lessen within 2 hours after you exercise. If you have any of these problems, stop doing these exercises  right away. Do not do them again unless your health care provider says that you can. Get help right away if: You develop sudden, severe back pain. If this happens, stop doing the exercises right away. Do not do them again unless your health care provider says that you can. This information is not intended to replace advice given to you by your health care provider. Make sure you discuss any questions you have with your health care provider. Document Revised: 11/25/2020 Document Reviewed: 11/25/2020 [employer] Patient Education  2022 [employer]

## 2021-06-03 LAB — COMPLETE METABOLIC PANEL WITH GFR
AG Ratio: 1.4 (calc) (ref 1.0–2.5)
ALT: 22 U/L (ref 6–29)
AST: 23 U/L (ref 10–30)
Albumin: 4.3 g/dL (ref 3.6–5.1)
Alkaline phosphatase (APISO): 62 U/L (ref 31–125)
BUN: 12 mg/dL (ref 7–25)
CO2: 23 mmol/L (ref 20–32)
Calcium: 9.1 mg/dL (ref 8.6–10.2)
Chloride: 105 mmol/L (ref 98–110)
Creat: 0.62 mg/dL (ref 0.50–0.96)
Globulin: 3.1 g/dL (calc) (ref 1.9–3.7)
Glucose, Bld: 73 mg/dL (ref 65–99)
Potassium: 4.3 mmol/L (ref 3.5–5.3)
Sodium: 139 mmol/L (ref 135–146)
Total Bilirubin: 0.8 mg/dL (ref 0.2–1.2)
Total Protein: 7.4 g/dL (ref 6.1–8.1)
eGFR: 124 mL/min/{1.73_m2} (ref >=60)

## 2021-06-28 ENCOUNTER — Ambulatory Visit: Payer: Medicaid Other | Admitting: Neurology

## 2021-06-28 NOTE — Progress Notes (Deleted)
[facility] Clinic Note - Initial Visit   Date: 06/28/21  Bethany Li MRN: [id number] DOB: June 20, 1991   Dear Dr. Emily Filbert:  Thank you for your kind referral of Bethany Li for consultation of ptosis. Although her history is well known to you, please allow [place] to reiterate it for the purpose of our medical record. The patient was accompanied to the clinic by *** who also provides collateral information.     History of Present Illness: Bethany Li is a 30 y.o. ***-handed female with ankylosing spondylitis, panuveitis, ***,  presenting for evaluation of bilateral ptosis.    Out-side paper records, electronic medical record, and images have been reviewed where available and summarized as: *** No results found for: HGBA1C No results found for: VITAMINB12 No results found for: TSH Lab Results  Component Value Date   ESRSEDRATE 6 04/30/2021    Past Medical History:  Diagnosis Date   Arthritis     No past surgical history on file.   Medications:  Outpatient Encounter Medications as of 06/28/2021  Medication Sig   azithromycin (ZITHROMAX) 250 MG tablet Take 1 tablet (250 mg total) by mouth daily. (Patient not taking: No sig reported)   fluconazole (DIFLUCAN) 150 MG tablet Take 1 tablet (150 mg total) by mouth daily. (Patient not taking: No sig reported)   folic acid (FOLVITE) 1 MG tablet Take 2 tablets (2 mg total) by mouth daily.   Ixekizumab (TALTZ) 80 MG/ML SOAJ Inject 80 mg into the skin every 28 (twenty-eight) days.   Methotrexate, PF, (RASUVO) 20 MG/0.4ML SOAJ Inject 20 mg into the skin once a week. Store at room temperature.   methylPREDNISolone (MEDROL DOSEPAK) 4 MG TBPK tablet Take as directed (Patient not taking: No sig reported)   norethindrone-ethinyl estradiol-FE (LOESTRIN FE) 1-20 MG-MCG tablet 1 tab(s)   predniSONE (DELTASONE) 5 MG tablet Take 1 tablet (5 mg total) by mouth daily with breakfast.   No  facility-administered encounter medications on file as of 06/28/2021.    Allergies: No Known Allergies  Family History: Family History  Problem Relation Age of Onset   Rheum arthritis Father     Social History: Social History   Tobacco Use   Smoking status: Some Days   Smokeless tobacco: Never   Tobacco comments:    Hooka  Vaping Use   Vaping Use: Some days  Substance Use Topics   Alcohol use: Yes    Comment: occassionally   Drug use: Not Currently   Social History   Social History Narrative   Not on file    Vital Signs:  There were no vitals taken for this visit.   General Medical Exam:  *** General:  Well appearing, comfortable.   Eyes/ENT: see cranial nerve examination.   Neck:   No carotid bruits. Respiratory:  Clear to auscultation, good air entry bilaterally.   Cardiac:  Regular rate and rhythm, no murmur.   Extremities:  No deformities, edema, or skin discoloration.  Skin:  No rashes or lesions.  Neurological Exam: MENTAL STATUS including orientation to time, place, person, recent and remote memory, attention span and concentration, language, and fund of knowledge is ***normal.  Speech is not dysarthric.  CRANIAL NERVES: II:  No visual field defects.  Unremarkable fundi.   III-IV-VI: Pupils equal round and reactive to light.  Normal conjugate, extra-ocular eye movements in all directions of gaze.  No nystagmus.  No ptosis***.   V:  Normal facial sensation.    VII:  Normal facial symmetry  and movements.   VIII:  Normal hearing and vestibular function.   IX-X:  Normal palatal movement.   XI:  Normal shoulder shrug and head rotation.   XII:  Normal tongue strength and range of motion, no deviation or fasciculation.  MOTOR:  No atrophy, fasciculations or abnormal movements.  No pronator drift.   Upper Extremity:  Right  Left  Deltoid  5/5   5/5   Biceps  5/5   5/5   Triceps  5/5   5/5   Infraspinatus 5/5  5/5  Medial pectoralis 5/5  5/5  Wrist  extensors  5/5   5/5   Wrist flexors  5/5   5/5   Finger extensors  5/5   5/5   Finger flexors  5/5   5/5   Dorsal interossei  5/5   5/5   Abductor pollicis  5/5   5/5   Tone (Ashworth scale)  0  0   Lower Extremity:  Right  Left  Hip flexors  5/5   5/5   Hip extensors  5/5   5/5   Adductor 5/5  5/5  Abductor 5/5  5/5  Knee flexors  5/5   5/5   Knee extensors  5/5   5/5   Dorsiflexors  5/5   5/5   Plantarflexors  5/5   5/5   Toe extensors  5/5   5/5   Toe flexors  5/5   5/5   Tone (Ashworth scale)  0  0   MSRs:  Right        Left                  brachioradialis 2+  2+  biceps 2+  2+  triceps 2+  2+  patellar 2+  2+  ankle jerk 2+  2+  Hoffman no  no  plantar response down  down   SENSORY:  Normal and symmetric perception of light touch, pinprick, vibration, and proprioception.  Romberg's sign absent.   COORDINATION/GAIT: Normal finger-to- nose-finger and heel-to-shin.  Intact rapid alternating movements bilaterally.  Able to rise from a chair without using arms.  Gait narrow based and stable. Tandem and stressed gait intact.    IMPRESSION: ***  PLAN/RECOMMENDATIONS:  *** Return to clinic in *** months.  Total time spent: ***   Thank you for allowing me to participate in patient's care.  If I can answer any additional questions, I would be pleased to do so.    Sincerely,    Tycen Dockter K. Allena Katz, DO

## 2021-08-01 ENCOUNTER — Other Ambulatory Visit: Payer: Self-pay | Admitting: Rheumatology

## 2021-08-02 NOTE — Telephone Encounter (Signed)
Next Visit: 09/01/2021  Last Visit: 06/02/2021  Last Fill: 04/30/2021  Dx: Ankylosing spondylitis of multiple sites in spine   Current Dose per office note on 06/02/2021: prednisone 5 mg daily and even higher doses for many years.  She has been unable to taper prednisone in the past.  We discussed tapering prednisone at the follow-up visit by 1 mg every 2 months if possible. We discussed tapering prednisone at the next visit.  Okay to refill Prednisone?

## 2021-08-03 ENCOUNTER — Other Ambulatory Visit (HOSPITAL_COMMUNITY): Payer: Self-pay

## 2021-08-03 ENCOUNTER — Other Ambulatory Visit: Payer: Self-pay | Admitting: Rheumatology

## 2021-08-03 DIAGNOSIS — M450 Ankylosing spondylitis of multiple sites in spine: Secondary | ICD-10-CM

## 2021-08-03 MED ORDER — TALTZ 80 MG/ML ~~LOC~~ SOAJ
80.0000 mg | SUBCUTANEOUS | 0 refills | Status: DC
  Filled 2021-08-03: qty 3, 84d supply, fill #0

## 2021-08-03 MED ORDER — RASUVO 20 MG/0.4ML ~~LOC~~ SOAJ
20.0000 mg | SUBCUTANEOUS | 0 refills | Status: DC
Start: 2021-08-03 — End: 2022-01-04
  Filled 2021-08-03: qty 4.8, 84d supply, fill #0

## 2021-08-03 NOTE — Telephone Encounter (Signed)
Spoke with patient and she states she is on the Rasuvo 20 mg. Patient advised she is due to update labs this month.

## 2021-08-03 NOTE — Telephone Encounter (Signed)
Next Visit: 09/01/2021  Last Visit: 06/02/2021  Last Fill: 05/06/2021   DX:Ankylosing spondylitis of multiple sites in spine   Current Dose per office note 06/02/2021: Taltz 80 mg sq injections every 4 weeks, methotrexate 22.5 mg weekly  Labs: 04/30/2021 All the labs are within normal limits. 06/02/2021 CMP with GFR WNL  TB Gold: 04/30/2021 Neg    Okay to refill Altamease Oiler and Rasuvo?

## 2021-08-03 NOTE — Telephone Encounter (Signed)
Please clarify if the patient is on rasuvo 22.5 or 20 mg weekly.  Dr. Fatima Sanger noted on 06/02/21 states  she was on rasuvo 22.5 weekly.

## 2021-08-10 ENCOUNTER — Other Ambulatory Visit: Payer: Self-pay | Admitting: *Deleted

## 2021-08-10 ENCOUNTER — Other Ambulatory Visit (HOSPITAL_COMMUNITY): Payer: Self-pay

## 2021-08-10 DIAGNOSIS — Z79899 Other long term (current) drug therapy: Secondary | ICD-10-CM

## 2021-08-10 DIAGNOSIS — Z111 Encounter for screening for respiratory tuberculosis: Secondary | ICD-10-CM

## 2021-08-10 DIAGNOSIS — Z9225 Personal history of immunosupression therapy: Secondary | ICD-10-CM

## 2021-08-10 LAB — COMPLETE METABOLIC PANEL WITH GFR
AG Ratio: 1.5 (calc) (ref 1.0–2.5)
ALT: 35 U/L — ABNORMAL HIGH (ref 6–29)
AST: 29 U/L (ref 10–30)
Albumin: 4.4 g/dL (ref 3.6–5.1)
Alkaline phosphatase (APISO): 59 U/L (ref 31–125)
BUN: 17 mg/dL (ref 7–25)
CO2: 24 mmol/L (ref 20–32)
Calcium: 9.6 mg/dL (ref 8.6–10.2)
Chloride: 106 mmol/L (ref 98–110)
Creat: 0.76 mg/dL (ref 0.50–0.96)
Globulin: 2.9 g/dL (calc) (ref 1.9–3.7)
Glucose, Bld: 105 mg/dL — ABNORMAL HIGH (ref 65–99)
Potassium: 4.4 mmol/L (ref 3.5–5.3)
Sodium: 138 mmol/L (ref 135–146)
Total Bilirubin: 0.6 mg/dL (ref 0.2–1.2)
Total Protein: 7.3 g/dL (ref 6.1–8.1)
eGFR: 109 mL/min/{1.73_m2} (ref >=60)

## 2021-08-10 LAB — CBC WITH DIFFERENTIAL/PLATELET
Absolute Monocytes: 350 cells/uL (ref 200–950)
Basophils Absolute: 20 cells/uL (ref 0–200)
Basophils Relative: 0.4 %
Eosinophils Absolute: 30 cells/uL (ref 15–500)
Eosinophils Relative: 0.6 %
HCT: 39.3 % (ref 35.0–45.0)
Hemoglobin: 13.2 g/dL (ref 11.7–15.5)
Lymphs Abs: 670 cells/uL — ABNORMAL LOW (ref 850–3900)
MCH: 29.1 pg (ref 27.0–33.0)
MCHC: 33.6 g/dL (ref 32.0–36.0)
MCV: 86.6 fL (ref 80.0–100.0)
MPV: 10.9 fL (ref 7.5–12.5)
Monocytes Relative: 7 %
Neutro Abs: 3930 cells/uL (ref 1500–7800)
Neutrophils Relative %: 78.6 %
Platelets: 233 10*3/uL (ref 140–400)
RBC: 4.54 10*6/uL (ref 3.80–5.10)
RDW: 13.2 % (ref 11.0–15.0)
Total Lymphocyte: 13.4 %
WBC: 5 10*3/uL (ref 3.8–10.8)

## 2021-08-11 NOTE — Progress Notes (Signed)
Liver functions mildly elevated.  Patient was taking ibuprofen for headaches.  Please advise her to stop ibuprofen.  We will continue to monitor labs.  CBC is normal except lymphocyte count is mildly decreased.

## 2021-08-18 NOTE — Progress Notes (Deleted)
Office Visit Note  Patient: Bethany Li             Date of Birth: [birth year]           MRN: [id number]             PCP: Jackie Plum, MD Referring: Jackie Plum, MD Visit Date: 09/01/2021 Occupation: @GUAROCC @  Subjective:  No chief complaint on file.   History of Present Illness: Bethany Li is a 30 y.o. female ***   Activities of Daily Living:  Patient reports morning stiffness for *** {minute/hour:19697}.   Patient {ACTIONS;DENIES/REPORTS:21021675::"Denies"} nocturnal pain.  Difficulty dressing/grooming: {ACTIONS;DENIES/REPORTS:21021675::"Denies"} Difficulty climbing stairs: {ACTIONS;DENIES/REPORTS:21021675::"Denies"} Difficulty getting out of chair: {ACTIONS;DENIES/REPORTS:21021675::"Denies"} Difficulty using hands for taps, buttons, cutlery, and/or writing: {ACTIONS;DENIES/REPORTS:21021675::"Denies"}  No Rheumatology ROS completed.   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 Social History   Social History Narrative   Not on file    There is no immunization history on file for this patient.   Objective: Vital Signs: There were no vitals taken for this visit.   Physical Exam   Musculoskeletal Exam: ***  CDAI Exam: CDAI Score: -- Patient Global: --; Provider Global: -- Swollen: --; Tender: -- Joint Exam 09/01/2021   No joint exam has been documented for this visit   There is currently no information documented on the homunculus. Go to the Rheumatology activity and complete the homunculus joint exam.  Investigation: No additional findings.  Imaging: No  results found.  Recent Labs: Lab Results  Component Value Date   WBC 5.0 08/10/2021   HGB 13.2 08/10/2021   PLT 233 08/10/2021   NA 138 08/10/2021   K 4.4 08/10/2021   CL 106 08/10/2021   CO2 24 08/10/2021   GLUCOSE 105 (H) 08/10/2021   BUN 17 08/10/2021   CREATININE 0.76 08/10/2021   BILITOT 0.6 08/10/2021   ALKPHOS 47 07/23/2020   AST 29 08/10/2021   ALT 35 (H) 08/10/2021   PROT 7.3 08/10/2021   ALBUMIN 4.1 07/23/2020   CALCIUM 9.6 08/10/2021   QFTBGOLDPLUS NEGATIVE 04/30/2021    Speciality Comments: Diagnosed in 06/30/2021.  Treated with Remicade, Humira and Simponi per patient.    Appointment every 3 months  Procedures:  No procedures performed Allergies: Patient has no known allergies.   Assessment / Plan:     Visit Diagnoses: Ankylosing spondylitis of multiple sites in spine (HCC)  High risk medication use  Panuveitis of both eyes  Long term (current) use of systemic steroids  Chronic SI joint pain  Pustular psoriasis  Chronic nonintractable headache, unspecified headache type  Other fatigue  Orders: No orders of the defined types were placed in this encounter.  No orders of the defined types were placed in this encounter.   Face-to-face time spent with patient was *** minutes. Greater than 50% of time was spent in counseling and coordination of care.  Follow-Up Instructions: No follow-ups on file.   [place], PA-C  Note - This record has been created using Dragon software.  Chart creation errors have been sought, but may not always  have been located. Such creation errors do not reflect on  the standard of medical care.

## 2021-09-01 ENCOUNTER — Ambulatory Visit: Payer: Medicaid Other | Admitting: Physician Assistant

## 2021-09-01 DIAGNOSIS — R519 Headache, unspecified: Secondary | ICD-10-CM

## 2021-09-01 DIAGNOSIS — L401 Generalized pustular psoriasis: Secondary | ICD-10-CM

## 2021-09-01 DIAGNOSIS — Z7952 Long term (current) use of systemic steroids: Secondary | ICD-10-CM

## 2021-09-01 DIAGNOSIS — R5383 Other fatigue: Secondary | ICD-10-CM

## 2021-09-01 DIAGNOSIS — G8929 Other chronic pain: Secondary | ICD-10-CM

## 2021-09-01 DIAGNOSIS — H44113 Panuveitis, bilateral: Secondary | ICD-10-CM

## 2021-09-01 DIAGNOSIS — M450 Ankylosing spondylitis of multiple sites in spine: Secondary | ICD-10-CM

## 2021-09-01 DIAGNOSIS — Z79899 Other long term (current) drug therapy: Secondary | ICD-10-CM

## 2021-09-08 NOTE — Progress Notes (Signed)
[facility] Neu[facility] Clinic Note - Initial Visit   Date: 09/10/21  Bethany Li MRN: [id number] DOB: 09-18-1991   Dear Dr. Emily Filbert:  Thank you for your kind referral of Bethany Li for consultation of ptosis. Although her history is well known to you, please allow [place] to reiterate it for the purpose of our medical record. The patient was accompanied to the clinic by self.    History of Present Illness: Bethany Li is a 30 y.o. right-handed female with pustular psoriasis, ankylosing spondylitis, and panuveitis presenting for evaluation of left ptosis.   She had eye surgery for left eye glaucoma in 2020.  Following her surgery, she began having sensation that her left eye was "dead" and reports poor vision along with droopiness of the eyelid. She denies double vision, difficulty swallowing/talking, or limb weakness.  Her left eyelid feels heavy, but does not completely close. Symptoms are constant and unchanged as the day progresses.  She stopped wearing eyelashes because her lid feels heavy.  She was seen by her eye doctor was referred her for evaluation of myasthenia gravis due to ptosis.   Past Medical History:  Diagnosis Date   Arthritis     History reviewed. No pertinent surgical history.   Medications:  Outpatient Encounter Medications as of 09/10/2021  Medication Sig   folic acid (FOLVITE) 1 MG tablet Take 2 tablets (2 mg total) by mouth daily.   Ixekizumab (TALTZ) 80 MG/ML SOAJ Inject 80 mg into the skin every 28 (twenty-eight) days.   Methotrexate, PF, (RASUVO) 20 MG/0.4ML SOAJ Inject 20 mg into the skin once a week. Store at room temperature.   norethindrone-ethinyl estradiol-FE (LOESTRIN FE) 1-20 MG-MCG tablet 1 tab(s)   predniSONE (DELTASONE) 5 MG tablet TAKE 1 TABLET BY MOUTH EVERY DAY WITH BREAKFAST   [DISCONTINUED] azithromycin (ZITHROMAX) 250 MG tablet Take 1 tablet (250 mg total) by mouth daily. (Patient not taking: No sig  reported)   [DISCONTINUED] fluconazole (DIFLUCAN) 150 MG tablet Take 1 tablet (150 mg total) by mouth daily. (Patient not taking: No sig reported)   [DISCONTINUED] methylPREDNISolone (MEDROL DOSEPAK) 4 MG TBPK tablet Take as directed (Patient not taking: Reported on 04/30/2021)   No facility-administered encounter medications on file as of 09/10/2021.    Allergies: No Known Allergies  Family History: Family History  Problem Relation Age of Onset   Rheum arthritis Father     Social History: Social History   Tobacco Use   Smoking status: Some Days   Smokeless tobacco: Never   Tobacco comments:    Hooka  Vaping Use   Vaping Use: Some days  Substance Use Topics   Alcohol use: Yes    Comment: occassionally   Drug use: Not Currently   Social History   Social History Narrative   Right Handed    Lives in a two story home     Vital Signs:  BP 106/72    Pulse 85    Ht 5\' 7"  (1.702 m)    Wt 126 lb (57.2 kg)    SpO2 97%    BMI 19.73 kg/m    Neurological Exam: MENTAL STATUS including orientation to time, place, person, recent and remote memory, attention span and concentration, language, and fund of knowledge is normal.  Speech is not dysarthric.  CRANIAL NERVES: II:  No visual field defects.    III-IV-VI: Pupils equal round and reactive to light.  Normal conjugate, extra-ocular eye movements in all directions of gaze.  No nystagmus.  Mild left  ptosis lid at the level of the iris, no worsening with sustained upgaze.   V:  Normal facial sensation.    VII:  Normal facial symmetry and movements.  Facial muscles are 5/5 throughout. VIII:  Normal hearing and vestibular function.   IX-X:  Normal palatal movement.   XI:  Normal shoulder shrug and head rotation.   XII:  Normal tongue strength and range of motion, no deviation or fasciculation.  MOTOR: Motor strength is 5/5 throughout. No fatigability.  No atrophy, fasciculations or abnormal movements.  No pronator drift.    MSRs: Reflexes are 1+/4 throughout. Plantars are downgoing.  SENSORY:  Normal and symmetric perception of light touch, vibration, and proprioception.   COORDINATION/GAIT: Normal finger-to- nose-finger.  Intact rapid alternating movements bilaterally.  Gait narrow based and stable. Tandem and stressed gait intact.    IMPRESSION: Left ptosis without diurnal variation and no fatigable weakness on exam makes neuromuscular junction disorder unlikely. To be sure,I will check AChR antibodies especially given her history of autoimmune disease. If this is normal, then she will follow-up with eye specialist for consideration of blepharoplasty.   Thank you for allowing me to participate in patient's care.  If I can answer any additional questions, I would be pleased to do so.    Sincerely,    Rowe Warman K. Allena Katz, DO

## 2021-09-10 ENCOUNTER — Ambulatory Visit: Payer: Medicaid Other | Admitting: Neurology

## 2021-09-10 ENCOUNTER — Encounter: Payer: Self-pay | Admitting: Neurology

## 2021-09-10 ENCOUNTER — Other Ambulatory Visit: Payer: Self-pay

## 2021-09-10 ENCOUNTER — Other Ambulatory Visit (INDEPENDENT_AMBULATORY_CARE_PROVIDER_SITE_OTHER): Payer: Medicaid Other

## 2021-09-10 VITALS — BP 106/72 | HR 85 | Ht 67.0 in | Wt 126.0 lb

## 2021-09-10 DIAGNOSIS — H02402 Unspecified ptosis of left eyelid: Secondary | ICD-10-CM

## 2021-09-10 NOTE — Patient Instructions (Addendum)
Check labs  We will call you with the results 

## 2021-09-22 LAB — MYASTHENIA GRAVIS PANEL 2
A CHR BINDING ABS: <0.3 nmol/L
ACHR Blocking Abs: <15 % Inhibition (ref <15)
Acetylchol Modul Ab: 3 % Inhibition

## 2021-09-23 ENCOUNTER — Telehealth: Payer: Self-pay

## 2021-09-23 NOTE — Telephone Encounter (Signed)
Patient called back and was provided her with Myasthenia Gravis lab results and recommendations. Patient verbalized understanding and had no further questions or concerns.

## 2021-10-15 NOTE — Progress Notes (Signed)
Office Visit Note  Patient: Bethany Li             Date of Birth: 04-14-1991           MRN: [id number]             PCP: Benito Mccreedy, MD Referring: Benito Mccreedy, MD Visit Date: 10/20/2021 Occupation: @GUAROCC @  Subjective:  Pain in multiple joints  History of Present Illness: Bethany Li is a 31 y.o. female with a history of ankylosing spondylitis and uveitis.  She states she has been having recurrent flares of uveitis despite being on Taltz and methotrexate.  She was seen by ophthalmologist Dr. Delman Cheadle yesterday and was given eyedrops.  He continues to take prednisone 5 mg p.o. daily due to recurrent uveitis flares.  She has not seen any joint swelling.  Although she continues to have pain in her hands and feet.  She also has discomfort in her back.  She states hard for her to sit up straight.  She gets frequent headaches.  She gives history of insomnia and increased fatigue.  Activities of Daily Living:  Patient reports morning stiffness for several hours.   Patient Reports nocturnal pain.  Difficulty dressing/grooming: Denies Difficulty climbing stairs: Reports Difficulty getting out of chair: Reports Difficulty using hands for taps, buttons, cutlery, and/or writing: Reports  Review of Systems  Constitutional:  Positive for fatigue.  HENT:  Positive for mouth dryness. Negative for mouth sores and nose dryness.   Eyes:  Positive for photophobia, pain, redness and visual disturbance. Negative for dryness.  Respiratory:  Positive for shortness of breath.   Cardiovascular:  Positive for chest pain. Negative for palpitations.  Gastrointestinal:  Positive for constipation. Negative for blood in stool and diarrhea.  Endocrine: Negative for increased urination.  Genitourinary:  Negative for difficulty urinating.  Musculoskeletal:  Positive for joint pain, joint pain, joint swelling, myalgias, muscle weakness, morning stiffness, muscle tenderness and myalgias.   Skin:  Negative for color change, rash, redness and sensitivity to sunlight.  Allergic/Immunologic: Positive for susceptible to infections.  Neurological:  Positive for dizziness, numbness, headaches and memory loss.  Hematological:  Positive for bruising/bleeding tendency. Negative for swollen glands.  Psychiatric/Behavioral:  Positive for sleep disturbance. Negative for depressed mood and confusion. The patient is not nervous/anxious.    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 No pertinent surgical history. Social History   Social History Narrative   Right Handed    Lives in a two story home     There is no immunization history on file for this patient.   Objective: Vital Signs: BP 137/77 (BP Location: Right Arm, Patient Position: Standing, Cuff Size: Normal)    Pulse 87    Ht 5\' 7"  (1.702 m)    Wt 125 lb 12.8 oz (57.1 kg)    BMI 19.70 kg/m    Physical Exam Vitals and nursing note reviewed.  Constitutional:      Appearance: She is well-developed.  HENT:     Head: Normocephalic and atraumatic.  Eyes:     Conjunctiva/sclera: Conjunctivae normal.  Cardiovascular:  Rate and Rhythm: Normal rate and regular rhythm.     Heart sounds: Normal heart sounds.  Pulmonary:     Effort: Pulmonary effort is normal.     Breath sounds: Normal breath sounds.  Abdominal:     General: Bowel sounds are normal.     Palpations: Abdomen is soft.  Musculoskeletal:     Cervical back: Normal range of motion.  Lymphadenopathy:     Cervical: No cervical adenopathy.  Skin:    General: Skin is warm and dry.     Capillary Refill: Capillary  refill takes less than 2 seconds.  Neurological:     Mental Status: She is alert and oriented to person, place, and time.  Psychiatric:        Behavior: Behavior normal.     Musculoskeletal Exam: C-spine thoracic and lumbar spine were not good range of motion.  She had discomfort in the thoracic region and over SI joints.  Shoulder joints, elbow joints, wrist joints, MCPs PIPs and DIPs with good range of motion with no synovitis.  Hip joints, knee joints, ankles, MTPs and PIPs with good range of motion with no synovitis.  CDAI Exam: CDAI Score: -- Patient Global: --; Provider Global: -- Swollen: 0 ; Tender: 0  Joint Exam 10/20/2021   No joint exam has been documented for this visit   There is currently no information documented on the homunculus. Go to the Rheumatology activity and complete the homunculus joint exam.  Investigation: No additional findings.  Imaging: No results found.  Recent Labs: Lab Results  Component Value Date   WBC 5.0 08/10/2021   HGB 13.2 08/10/2021   PLT 233 08/10/2021   NA 138 08/10/2021   K 4.4 08/10/2021   CL 106 08/10/2021   CO2 24 08/10/2021   GLUCOSE 105 (H) 08/10/2021   BUN 17 08/10/2021   CREATININE 0.76 08/10/2021   BILITOT 0.6 08/10/2021   ALKPHOS 47 07/23/2020   AST 29 08/10/2021   ALT 35 (H) 08/10/2021   PROT 7.3 08/10/2021   ALBUMIN 4.1 07/23/2020   CALCIUM 9.6 08/10/2021   QFTBGOLDPLUS NEGATIVE 04/30/2021    Speciality Comments: Diagnosed in [place].  Treated with Remicade, Humira and Simponi per patient.    Appointment every 3 months  Procedures:  No procedures performed Allergies: Patient has no known allergies.   Assessment / Plan:     Visit Diagnoses: Ankylosing spondylitis of multiple sites in spine [facility]) - Diagnosed 2019 in [place].  She had an inadequate response to Remicade, Humira and Simponi in the past.  She had been on Taltz since 2021.  She continues to have discomfort in her back and SI joints.  No  synovitis was noted on the examination.  She continues to take prednisone due to frequent uveitis.  Panuveitis of both eyes - Followed by [facility] ophthalmology.  She uses prednisone eyedrops as needed flares.  Last flare was in last week.  Patient states she was seen by ophthalmologist and was given prednisone eyedrops.  She is unable to taper off prednisone.  High risk medication use - Taltz 80 mg sq injections every 4 weeks, methotrexate 20 mg weekly.  H/o an inadequate response to Remicade and Humira.Simponi-she developed psoriasis on Simponi per patient.  Labs obtained on August 10, 2021 were within normal limits except for ALT 35.  TB gold was negative on April 30, 2021.  Information about immunization was placed in the AVS.  She was advised to stop methotrexate and Toltz in case she  develops an infection.  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  She has had no recurrence of psoriasis.  It was only related to the medication.  Long term (current) use of systemic steroids - has been on prednisone 5 mg daily and even higher doses for many years.  She has been unable to taper prednisone in the past.  She is not ready to taper prednisone.  Chronic midline low back pain without sciatica - X-rays were unremarkable.  Chronic SI joint pain - History of intermittent SI joint pain.  X-rays were unremarkable.    Chronic nonintractable headache, unspecified headache type-patient was advised to discuss this further with her PCP.  Myofascial pain-she continues to have some generalized pain and discomfort.  I believe she may have some myofascial pain.  I discussed possible use of Cymbalta.  She is not interested on starting Cymbalta at this time.  Need for regular exercise was discussed.  She states she does water aerobics once a week.  Due to her busy schedule she does not have time to exercise.  Work accommodation-patient brought forms for [place] to fill for work  accommodation.  She states due to constant backache she is unable to sit for prolonged time.  She also has frequent burning in her eyes and has to uses eyedrops.  She would prefer to work from home for that reason.  We will review forms with her and fill the forms as needed for her job accommodation.  Orders: No orders of the defined types were placed in this encounter.  No orders of the defined types were placed in this encounter.  Time spent in history taking, chart review, evaluating patient and filling out the forms was over 60 minutes.  Follow-Up Instructions: Return for Ankylosing spondylitis.   Bo Merino, MD  Note - This record has been created using Editor, commissioning.  Chart creation errors have been sought, but may not always  have been located. Such creation errors do not reflect on  the standard of medical care.

## 2021-10-20 ENCOUNTER — Other Ambulatory Visit: Payer: Self-pay

## 2021-10-20 ENCOUNTER — Ambulatory Visit (INDEPENDENT_AMBULATORY_CARE_PROVIDER_SITE_OTHER): Payer: Medicaid Other | Admitting: Rheumatology

## 2021-10-20 ENCOUNTER — Telehealth: Payer: Self-pay | Admitting: *Deleted

## 2021-10-20 ENCOUNTER — Encounter: Payer: Self-pay | Admitting: Rheumatology

## 2021-10-20 VITALS — BP 137/77 | HR 87 | Ht 67.0 in | Wt 125.8 lb

## 2021-10-20 DIAGNOSIS — M545 Low back pain, unspecified: Secondary | ICD-10-CM

## 2021-10-20 DIAGNOSIS — L401 Generalized pustular psoriasis: Secondary | ICD-10-CM | POA: Diagnosis not present

## 2021-10-20 DIAGNOSIS — M450 Ankylosing spondylitis of multiple sites in spine: Secondary | ICD-10-CM

## 2021-10-20 DIAGNOSIS — M533 Sacrococcygeal disorders, not elsewhere classified: Secondary | ICD-10-CM

## 2021-10-20 DIAGNOSIS — H44113 Panuveitis, bilateral: Secondary | ICD-10-CM | POA: Diagnosis not present

## 2021-10-20 DIAGNOSIS — Z7952 Long term (current) use of systemic steroids: Secondary | ICD-10-CM

## 2021-10-20 DIAGNOSIS — R519 Headache, unspecified: Secondary | ICD-10-CM

## 2021-10-20 DIAGNOSIS — Z79899 Other long term (current) drug therapy: Secondary | ICD-10-CM | POA: Diagnosis not present

## 2021-10-20 DIAGNOSIS — G8929 Other chronic pain: Secondary | ICD-10-CM

## 2021-10-20 DIAGNOSIS — M7918 Myalgia, other site: Secondary | ICD-10-CM

## 2021-10-20 NOTE — Patient Instructions (Signed)
Standing Labs °We placed an order today for your standing lab work.  ° °Please have your standing labs drawn in February and every 3 months ° °If possible, please have your labs drawn 2 weeks prior to your appointment so that the provider can discuss your results at your appointment. ° °Please note that you may see your imaging and lab results in MyChart before we have reviewed them. °We may be awaiting multiple results to interpret others before contacting you. °Please allow our office up to 72 hours to thoroughly review all of the results before contacting the office for clarification of your results. ° °We have open lab daily: °Monday through Thursday from 1:30-4:30 PM and Friday from 1:30-4:00 PM °at the office of Dr. Kemp Gomes, [facility].   °Please be advised, all patients with office appointments requiring lab work will take precedent over walk-in lab work.  °If possible, please come for your lab work on Monday and Friday afternoons, as you may experience shorter wait times. °The office is located at [address], [address], [place],  [address] °No appointment is necessary.   °Labs are drawn by [facility]. Please bring your co-pay at the time of your lab draw.  You may receive a bill from [facility] for your lab work. ° °Please note if you are on Hydroxychloroquine and and an order has been placed for a Hydroxychloroquine level, you will need to have it drawn 4 hours or more after your last dose. ° °If you wish to have your labs drawn at another location, please call the office 24 hours in advance to send orders. ° °If you have any questions regarding directions or hours of operation,  °please call [phone].   °As a reminder, please drink plenty of water prior to coming for your lab work. Thanks!  °Vaccines °You are taking a medication(s) that can suppress your immune system.  The following immunizations are recommended: °Flu annually °Covid-19  °Td/Tdap (tetanus, diphtheria,  pertussis) every 10 years °Pneumonia (Prevnar 15 then Pneumovax 23 at least 1 year apart.  Alternatively, can take Prevnar 20 without needing additional dose) °Shingrix: 2 doses from 4 weeks to 6 months apart ° °Please check with your PCP to make sure you are up to date.  ° °If you have signs or symptoms of an infection or start antibiotics: °First, call your PCP for workup of your infection. °Hold your medication through the infection, until you complete your antibiotics, and until symptoms resolve if you take the following: °Injectable medication (Actemra, Benlysta, Cimzia, Cosentyx, Enbrel, Humira, Kevzara, Orencia, Remicade, Simponi, Stelara, Taltz, Tremfya) °Methotrexate °Leflunomide (Arava) °Mycophenolate (Cellcept) °Xeljanz, Olumiant, or Rinvoq  °

## 2021-10-20 NOTE — Telephone Encounter (Signed)
Spoke with patient and advised we were working on her FMLA paperwork. Patient advised Dr. Corliss Skains will only approve 1-2 episodes per month with 1-2 days per episode. Patient expressed understanding.

## 2021-11-08 ENCOUNTER — Other Ambulatory Visit (HOSPITAL_COMMUNITY): Payer: Self-pay

## 2021-11-10 ENCOUNTER — Other Ambulatory Visit (HOSPITAL_COMMUNITY): Payer: Self-pay

## 2021-11-12 ENCOUNTER — Other Ambulatory Visit: Payer: Self-pay | Admitting: Physician Assistant

## 2021-11-12 ENCOUNTER — Other Ambulatory Visit (HOSPITAL_COMMUNITY): Payer: Self-pay

## 2021-11-12 DIAGNOSIS — M450 Ankylosing spondylitis of multiple sites in spine: Secondary | ICD-10-CM

## 2021-11-12 MED ORDER — TALTZ 80 MG/ML ~~LOC~~ SOAJ
80.0000 mg | SUBCUTANEOUS | 0 refills | Status: DC
  Filled 2021-11-12: qty 1, 28d supply, fill #0

## 2021-11-12 NOTE — Telephone Encounter (Signed)
Next Visit: Due April 2023. Message sent to the front to schedule patient.   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 CBC is normal except lymphocyte count is mildly decreased.  TB Gold: 04/30/2021 Neg    Attempted to contact the patient and unable to leave a message mailbox full.   Okay to refill Bethany Li?

## 2021-11-12 NOTE — Telephone Encounter (Signed)
Please schedule patient for a follow up visit. Patient is due April 2023. Thanks!

## 2021-11-18 ENCOUNTER — Other Ambulatory Visit (HOSPITAL_COMMUNITY): Payer: Self-pay

## 2021-11-19 ENCOUNTER — Other Ambulatory Visit: Payer: Self-pay | Admitting: Rheumatology

## 2021-11-19 NOTE — Telephone Encounter (Signed)
Next Visit: 01/18/2022   Last Visit: 10/20/2021   Last Fill: 08/02/2021  DX:Ankylosing spondylitis of multiple sites in spine   Current Dose per office note 10/20/2021:has been on prednisone 5 mg daily and even higher doses for many years.  She has been unable to taper prednisone in the past.  She is not ready to taper prednisone.  Okay to refill Prednisone?

## 2021-12-10 ENCOUNTER — Other Ambulatory Visit (HOSPITAL_COMMUNITY): Payer: Self-pay

## 2021-12-13 ENCOUNTER — Other Ambulatory Visit (HOSPITAL_COMMUNITY): Payer: Self-pay

## 2021-12-15 ENCOUNTER — Other Ambulatory Visit (HOSPITAL_COMMUNITY): Payer: Self-pay

## 2021-12-22 ENCOUNTER — Other Ambulatory Visit (HOSPITAL_COMMUNITY): Payer: Self-pay

## 2021-12-22 ENCOUNTER — Other Ambulatory Visit: Payer: Self-pay | Admitting: Physician Assistant

## 2021-12-22 DIAGNOSIS — M450 Ankylosing spondylitis of multiple sites in spine: Secondary | ICD-10-CM

## 2021-12-28 ENCOUNTER — Other Ambulatory Visit: Payer: Self-pay | Admitting: *Deleted

## 2021-12-28 DIAGNOSIS — Z79899 Other long term (current) drug therapy: Secondary | ICD-10-CM

## 2021-12-29 LAB — CBC WITH DIFFERENTIAL/PLATELET
Absolute Monocytes: 418 cells/uL (ref 200–950)
Basophils Absolute: 28 cells/uL (ref 0–200)
Basophils Relative: 0.6 %
Eosinophils Absolute: 71 cells/uL (ref 15–500)
Eosinophils Relative: 1.5 %
HCT: 34.8 % — ABNORMAL LOW (ref 35.0–45.0)
Hemoglobin: 11.4 g/dL — ABNORMAL LOW (ref 11.7–15.5)
Lymphs Abs: 837 cells/uL — ABNORMAL LOW (ref 850–3900)
MCH: 29.5 pg (ref 27.0–33.0)
MCHC: 32.8 g/dL (ref 32.0–36.0)
MCV: 89.9 fL (ref 80.0–100.0)
MPV: 10.6 fL (ref 7.5–12.5)
Monocytes Relative: 8.9 %
Neutro Abs: 3346 cells/uL (ref 1500–7800)
Neutrophils Relative %: 71.2 %
Platelets: 196 10*3/uL (ref 140–400)
RBC: 3.87 10*6/uL (ref 3.80–5.10)
RDW: 13.6 % (ref 11.0–15.0)
Total Lymphocyte: 17.8 %
WBC: 4.7 10*3/uL (ref 3.8–10.8)

## 2021-12-29 LAB — COMPLETE METABOLIC PANEL WITH GFR
AG Ratio: 1.5 (calc) (ref 1.0–2.5)
ALT: 19 U/L (ref 6–29)
AST: 16 U/L (ref 10–30)
Albumin: 3.9 g/dL (ref 3.6–5.1)
Alkaline phosphatase (APISO): 35 U/L (ref 31–125)
BUN: 13 mg/dL (ref 7–25)
CO2: 25 mmol/L (ref 20–32)
Calcium: 9 mg/dL (ref 8.6–10.2)
Chloride: 108 mmol/L (ref 98–110)
Creat: 0.71 mg/dL (ref 0.50–0.97)
Globulin: 2.6 g/dL (calc) (ref 1.9–3.7)
Glucose, Bld: 92 mg/dL (ref 65–99)
Potassium: 3.9 mmol/L (ref 3.5–5.3)
Sodium: 140 mmol/L (ref 135–146)
Total Bilirubin: 0.7 mg/dL (ref 0.2–1.2)
Total Protein: 6.5 g/dL (ref 6.1–8.1)
eGFR: 117 mL/min/{1.73_m2} (ref >=60)

## 2021-12-29 NOTE — Progress Notes (Signed)
Hemoglobin is low at 11.4, CMP is normal.  Please forward results to her PCP.

## 2022-01-04 ENCOUNTER — Other Ambulatory Visit (HOSPITAL_COMMUNITY): Payer: Self-pay

## 2022-01-04 ENCOUNTER — Other Ambulatory Visit: Payer: Self-pay | Admitting: Physician Assistant

## 2022-01-04 DIAGNOSIS — M450 Ankylosing spondylitis of multiple sites in spine: Secondary | ICD-10-CM

## 2022-01-04 MED ORDER — RASUVO 20 MG/0.4ML ~~LOC~~ SOAJ
20.0000 mg | SUBCUTANEOUS | 0 refills | Status: DC
  Filled 2022-01-04: qty 4.8, 84d supply, fill #0

## 2022-01-04 MED ORDER — TALTZ 80 MG/ML ~~LOC~~ SOAJ
80.0000 mg | SUBCUTANEOUS | 0 refills | Status: DC
  Filled 2022-01-04: qty 3, 84d supply, fill #0

## 2022-01-04 NOTE — Telephone Encounter (Signed)
Next Visit: 01/18/2022 ? ?Last Visit: 10/20/2021 ? ?Last Fill: 11/12/2021 Altame[facility] Oiler), 08/03/2021 [facility]) ? ?IR:SWNIOEVOJJ spondylitis of multiple sites in spine  ? ?Current Dose per office note 10/20/2021: Taltz 80 mg sq injections every 4 weeks, methotrexate 20 mg weekly ? ?Labs: 12/28/2021 Hemoglobin is low at 11.4, CMP is normal ? ?TB Gold: 04/30/2021 Neg   ? ?Okay to refill Taltz and Rasuvo?  ?

## 2022-01-04 NOTE — Progress Notes (Deleted)
Office Visit Note  Patient: Bethany Li             Date of Birth: June 11, 1991           MRN: [id number]             PCP: Benito Mccreedy, MD Referring: Benito Mccreedy, MD Visit Date: 01/18/2022 Occupation: @GUAROCC @  Subjective:    History of Present Illness: Bethany Li is a 31 y.o. female with history of ankylosing spondylitis. She remains on Taltz 80 mg sq injections every 4 weeks and rasuvo 20 mg sq injections once weekly.  CBC and CMP updated on 12/28/21.  Her next lab work will be due in July and every 3 months to monitor for drug toxicity.  Standing orders for CBC and CMP remain in place.  TB gold negative on 04/30/21 and will continue to be monitored yearly. A future order will be placed today.  Discussed the importance of holding taltz and methotrexate if she develops signs or symptoms of an infection and to resume once the infection has completely cleared.    Activities of Daily Living:  Patient reports morning stiffness for *** {minute/hour:19697}.   Patient {ACTIONS;DENIES/REPORTS:21021675::"Denies"} nocturnal pain.  Difficulty dressing/grooming: {ACTIONS;DENIES/REPORTS:21021675::"Denies"} Difficulty climbing stairs: {ACTIONS;DENIES/REPORTS:21021675::"Denies"} Difficulty getting out of chair: {ACTIONS;DENIES/REPORTS:21021675::"Denies"} Difficulty using hands for taps, buttons, cutlery, and/or writing: {ACTIONS;DENIES/REPORTS:21021675::"Denies"}  No Rheumatology ROS completed.   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 Social History   Social History Narrative   Right Handed     Lives in a two story home     There is no immunization history on file for this patient.   Objective: Vital Signs: There were no vitals taken for this visit.   Physical Exam Vitals and nursing note reviewed.  Constitutional:      Appearance: She is well-developed.  HENT:     Head: Normocephalic and atraumatic.  Eyes:     Conjunctiva/sclera: Conjunctivae normal.  Cardiovascular:     Rate and Rhythm: Normal rate and regular rhythm.     Heart sounds: Normal heart sounds.  Pulmonary:     Effort: Pulmonary effort is normal.     Breath sounds: Normal breath sounds.  Abdominal:     General: Bowel sounds are normal.     Palpations: Abdomen is soft.  Musculoskeletal:     Cervical back: Normal range of motion.  Skin:    General: Skin is warm and dry.     Capillary Refill: Capillary refill takes less than 2 seconds.  Neurological:     Mental Status: She is alert and oriented to person, place, and time.  Psychiatric:        Behavior: Behavior normal.     Musculoskeletal Exam: ***  CDAI Exam: CDAI Score: -- Patient Global: --; Provider Global: -- Swollen: --; Tender: -- Joint Exam 01/18/2022   No joint exam has been documented for this visit   There is currently no information documented on the homunculus. Go to the Rheumatology activity and complete the homunculus joint exam.  Investigation: No additional findings.  Imaging: No results found.  Recent Labs: Lab Results  Component Value Date   WBC 4.7 12/28/2021   HGB 11.4 (L) 12/28/2021   PLT 196 12/28/2021   NA 140 12/28/2021   K 3.9 12/28/2021   CL 108 12/28/2021   CO2 25 12/28/2021   GLUCOSE 92 12/28/2021   BUN 13 12/28/2021   CREATININE 0.71 12/28/2021   BILITOT 0.7 12/28/2021   ALKPHOS 47 07/23/2020   AST 16 12/28/2021   ALT 19 12/28/2021   PROT 6.5 12/28/2021   ALBUMIN 4.1 07/23/2020   CALCIUM 9.0 12/28/2021   QFTBGOLDPLUS NEGATIVE 04/30/2021    Speciality Comments: Diagnosed in [place].   Treated with Remicade, Humira and Simponi per patient.    Appointment every 3 months  Procedures:  No procedures performed Allergies: Patient has no known allergies.   Assessment / Plan:     Visit Diagnoses: No diagnosis found.  Orders: No orders of the defined types were placed in this encounter.  No orders of the defined types were placed in this encounter.   Face-to-face time spent with patient was *** minutes. Greater than 50% of time was spent in counseling and coordination of care.  Follow-Up Instructions: No follow-ups on file.   Earnestine Mealing, CMA  Note - This record has been created using Editor, commissioning.  Chart creation errors have been sought, but may not always  have been located. Such creation errors do not reflect on  the standard of medical care.

## 2022-01-05 ENCOUNTER — Other Ambulatory Visit (HOSPITAL_COMMUNITY): Payer: Self-pay

## 2022-01-06 ENCOUNTER — Other Ambulatory Visit (HOSPITAL_COMMUNITY): Payer: Self-pay

## 2022-01-07 ENCOUNTER — Other Ambulatory Visit (HOSPITAL_COMMUNITY): Payer: Self-pay

## 2022-01-18 ENCOUNTER — Ambulatory Visit: Payer: Medicaid Other | Admitting: Physician Assistant

## 2022-01-18 DIAGNOSIS — G8929 Other chronic pain: Secondary | ICD-10-CM

## 2022-01-18 DIAGNOSIS — L401 Generalized pustular psoriasis: Secondary | ICD-10-CM

## 2022-01-18 DIAGNOSIS — R519 Headache, unspecified: Secondary | ICD-10-CM

## 2022-01-18 DIAGNOSIS — M450 Ankylosing spondylitis of multiple sites in spine: Secondary | ICD-10-CM

## 2022-01-18 DIAGNOSIS — M7918 Myalgia, other site: Secondary | ICD-10-CM

## 2022-01-18 DIAGNOSIS — Z7952 Long term (current) use of systemic steroids: Secondary | ICD-10-CM

## 2022-01-18 DIAGNOSIS — Z79899 Other long term (current) drug therapy: Secondary | ICD-10-CM

## 2022-01-18 DIAGNOSIS — H44113 Panuveitis, bilateral: Secondary | ICD-10-CM

## 2022-02-16 ENCOUNTER — Other Ambulatory Visit: Payer: Self-pay | Admitting: Rheumatology

## 2022-02-16 NOTE — Telephone Encounter (Signed)
Please schedule patient for a follow up visit. Patient was due April 2023. Thanks!  ?

## 2022-02-16 NOTE — Telephone Encounter (Signed)
Next Visit: Due April 2023. Message sent to the front to schedule patient.    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

## 2022-02-17 NOTE — Progress Notes (Deleted)
Office Visit Note  Patient: Bethany Li             Date of Birth: 1990-12-08           MRN: [id number]             PCP: Benito Mccreedy, MD Referring: Benito Mccreedy, MD Visit Date: 02/23/2022 Occupation: @GUAROCC @  Subjective:    History of Present Illness: Bethany Li is a 31 y.o. female with history of ankylosing spondylitis and panuveitis.  She is on taltz 80 mg sq injections every month and Rasuvo 20 mg sq injections once weekly. She remains on prednisone 5 mg daily.     CBC and CMP updated on 12/28/21.  She will be due to update lab work in July and every 3 months.  Standing orders for CBC and CMP remain in place.  TB gold negative on 08/10/21 and will continue to monitor yearly.   Activities of Daily Living:  Patient reports morning stiffness for *** {minute/hour:19697}.   Patient {ACTIONS;DENIES/REPORTS:21021675::"Denies"} nocturnal pain.  Difficulty dressing/grooming: {ACTIONS;DENIES/REPORTS:21021675::"Denies"} Difficulty climbing stairs: {ACTIONS;DENIES/REPORTS:21021675::"Denies"} Difficulty getting out of chair: {ACTIONS;DENIES/REPORTS:21021675::"Denies"} Difficulty using hands for taps, buttons, cutlery, and/or writing: {ACTIONS;DENIES/REPORTS:21021675::"Denies"}  No Rheumatology ROS completed.   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 Social History   Social History Narrative   Right Handed    Lives in a two story home     There is no immunization history on file for this patient.   Objective: Vital Signs: There were no vitals taken for this visit.   Physical  Exam Vitals and nursing note reviewed.  Constitutional:      Appearance: She is well-developed.  HENT:     Head: Normocephalic and atraumatic.  Eyes:     Conjunctiva/sclera: Conjunctivae normal.  Cardiovascular:     Rate and Rhythm: Normal rate and regular rhythm.     Heart sounds: Normal heart sounds.  Pulmonary:     Effort: Pulmonary effort is normal.     Breath sounds: Normal breath sounds.  Abdominal:     General: Bowel sounds are normal.     Palpations: Abdomen is soft.  Musculoskeletal:     Cervical back: Normal range of motion.  Skin:    General: Skin is warm and dry.     Capillary Refill: Capillary refill takes less than 2 seconds.  Neurological:     Mental Status: She is alert and oriented to person, place, and time.  Psychiatric:        Behavior: Behavior normal.     Musculoskeletal Exam: ***  CDAI Exam: CDAI Score: -- Patient Global: --; Provider Global: -- Swollen: --; Tender: -- Joint Exam 02/23/2022   No joint exam has been documented for this visit   There is currently no information documented on the homunculus. Go to the Rheumatology activity and complete the homunculus joint exam.  Investigation: No additional findings.  Imaging: No results found.  Recent Labs: Lab Results  Component Value Date   WBC 4.7 12/28/2021   HGB 11.4 (L) 12/28/2021   PLT 196 12/28/2021   NA  140 12/28/2021   K 3.9 12/28/2021   CL 108 12/28/2021   CO2 25 12/28/2021   GLUCOSE 92 12/28/2021   BUN 13 12/28/2021   CREATININE 0.71 12/28/2021   BILITOT 0.7 12/28/2021   ALKPHOS 47 07/23/2020   AST 16 12/28/2021   ALT 19 12/28/2021   PROT 6.5 12/28/2021   ALBUMIN 4.1 07/23/2020   CALCIUM 9.0 12/28/2021   QFTBGOLDPLUS NEGATIVE 04/30/2021    Speciality Comments: Diagnosed in [place].  Treated with Remicade, Humira and Simponi per patient.    Appointment every 3 months  Procedures:  No procedures performed Allergies: Patient has no known allergies.    Assessment / Plan:     Visit Diagnoses: Ankylosing spondylitis of multiple sites in spine ([facility])  High risk medication use  Panuveitis of both eyes  Pustular psoriasis  Long term (current) use of systemic steroids  Chronic SI joint pain  Chronic nonintractable headache, unspecified headache type  Myofascial pain  Other fatigue  Orders: No orders of the defined types were placed in this encounter.  No orders of the defined types were placed in this encounter.   Face-to-face time spent with patient was *** minutes. Greater than 50% of time was spent in counseling and coordination of care.  Follow-Up Instructions: No follow-ups on file.   Ofilia Neas, PA-C  Note - This record has been created using Dragon software.  Chart creation errors have been sought, but may not always  have been located. Such creation errors do not reflect on  the standard of medical care.

## 2022-02-23 ENCOUNTER — Ambulatory Visit: Payer: Medicaid Other | Admitting: Physician Assistant

## 2022-02-23 DIAGNOSIS — M7918 Myalgia, other site: Secondary | ICD-10-CM

## 2022-02-23 DIAGNOSIS — G8929 Other chronic pain: Secondary | ICD-10-CM

## 2022-02-23 DIAGNOSIS — Z7952 Long term (current) use of systemic steroids: Secondary | ICD-10-CM

## 2022-02-23 DIAGNOSIS — H44113 Panuveitis, bilateral: Secondary | ICD-10-CM

## 2022-02-23 DIAGNOSIS — L401 Generalized pustular psoriasis: Secondary | ICD-10-CM

## 2022-02-23 DIAGNOSIS — R5383 Other fatigue: Secondary | ICD-10-CM

## 2022-02-23 DIAGNOSIS — Z79899 Other long term (current) drug therapy: Secondary | ICD-10-CM

## 2022-02-23 DIAGNOSIS — R519 Headache, unspecified: Secondary | ICD-10-CM

## 2022-02-23 DIAGNOSIS — M450 Ankylosing spondylitis of multiple sites in spine: Secondary | ICD-10-CM

## 2022-03-09 ENCOUNTER — Ambulatory Visit: Payer: Medicaid Other | Admitting: Family Medicine

## 2022-03-22 ENCOUNTER — Other Ambulatory Visit (HOSPITAL_COMMUNITY): Payer: Self-pay

## 2022-03-23 ENCOUNTER — Encounter: Payer: Self-pay | Admitting: Physician Assistant

## 2022-03-23 ENCOUNTER — Ambulatory Visit: Payer: Medicaid Other | Admitting: Physician Assistant

## 2022-03-23 VITALS — BP 120/67 | HR 85 | Ht 67.0 in | Wt 124.0 lb

## 2022-03-23 DIAGNOSIS — M450 Ankylosing spondylitis of multiple sites in spine: Secondary | ICD-10-CM

## 2022-03-23 DIAGNOSIS — Z79899 Other long term (current) drug therapy: Secondary | ICD-10-CM

## 2022-03-23 DIAGNOSIS — R5383 Other fatigue: Secondary | ICD-10-CM

## 2022-03-23 DIAGNOSIS — L401 Generalized pustular psoriasis: Secondary | ICD-10-CM

## 2022-03-23 DIAGNOSIS — M7918 Myalgia, other site: Secondary | ICD-10-CM

## 2022-03-23 DIAGNOSIS — M722 Plantar fascial fibromatosis: Secondary | ICD-10-CM

## 2022-03-23 DIAGNOSIS — H44113 Panuveitis, bilateral: Secondary | ICD-10-CM | POA: Diagnosis not present

## 2022-03-23 DIAGNOSIS — G8929 Other chronic pain: Secondary | ICD-10-CM

## 2022-03-23 DIAGNOSIS — R519 Headache, unspecified: Secondary | ICD-10-CM

## 2022-03-23 DIAGNOSIS — M533 Sacrococcygeal disorders, not elsewhere classified: Secondary | ICD-10-CM

## 2022-03-23 DIAGNOSIS — Z7952 Long term (current) use of systemic steroids: Secondary | ICD-10-CM

## 2022-03-23 DIAGNOSIS — Z111 Encounter for screening for respiratory tuberculosis: Secondary | ICD-10-CM

## 2022-03-23 MED ORDER — PREDNISONE 5 MG PO TABS: ORAL_TABLET | ORAL | 0 refills | Status: DC

## 2022-03-23 NOTE — Progress Notes (Signed)
Office Visit Note  Patient: Bethany Li             Date of Birth: July 19, 1991           MRN: [id number]             PCP: Jackie Plum, MD Referring: Jackie Plum, MD Visit Date: 03/23/2022 Occupation: @GUAROCC @  Subjective:  Pain in both feet   History of Present Illness: Bethany Li is a 31 y.o. female with history of ankylosing spondylitis and panuveitis.  Patient remains on Taltz 80 mg sq injections every 28 days, Rasuvo 20 mg sq injections once weekly, folic acid 2 mg daily, and prednisone 5 mg daily.  She has been tolerating combination therapy without any side effects or recent or recurrent infections.  She has not missed any doses of Taltz or Rasuvo recently.  Patient reports for the past 1 month she has been experiencing increased pain in both feet.  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  She has been dancing without shoes which may be contributing to some of her symptoms.  She has been trying to wear shoes with good support and cushion while walking.  She denies any joint swelling but has noticed stiffness in her feet.  Most of her discomfort is in her heel.  She denies any other increased joint pain or joint swelling at this time.  She continues to have occasional flares of uveitis.  She has been following up closely with her ophthalmologist.  She states that she had left eye cataract surgery in May 2023 without complications.  She states that she still requires work accommodations for flares.  She states it is difficult for her to predict when the flares will occur and for how long they will last.  She has been trying to change her diet to avoid pro inflammatory foods. She denies any new medical conditions.   Activities of Daily Living:  Patient reports morning stiffness for 2-3 hours.   Patient Denies nocturnal pain.  Difficulty dressing/grooming: Denies Difficulty climbing stairs: Reports Difficulty getting out of chair:  Denies Difficulty using hands for taps, buttons, cutlery, and/or writing: Reports  Review of Systems  Constitutional:  Positive for fatigue.  HENT:  Negative for mouth sores, mouth dryness and nose dryness.   Eyes:  Positive for photophobia. Negative for pain, visual disturbance and dryness.  Respiratory:  Negative for cough, hemoptysis, shortness of breath and difficulty breathing.   Cardiovascular:  Negative for chest pain, palpitations, hypertension and swelling in legs/feet.  Gastrointestinal:  Negative for blood in stool, constipation and diarrhea.  Endocrine: Negative for increased urination.  Genitourinary:  Negative for painful urination.  Musculoskeletal:  Positive for joint pain, joint pain and morning stiffness. Negative for joint swelling, myalgias, muscle weakness, muscle tenderness and myalgias.  Skin:  Negative for color change, pallor, rash, hair loss, nodules/bumps, skin tightness, ulcers and sensitivity to sunlight.  Allergic/Immunologic: Negative for susceptible to infections.  Neurological:  Negative for numbness, headaches and weakness.  Hematological:  Positive for bruising/bleeding tendency. Negative for swollen glands.  Psychiatric/Behavioral:  Negative for depressed mood and sleep disturbance. The patient is not nervous/anxious.     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 No pertinent surgical history. Social History   Social History Narrative   Right Handed    Lives in a two story home     There is no immunization history on file for this patient.   Objective: Vital Signs: BP 120/67   Pulse 85   Ht  5\' 7"  (1.702 m)   Wt 124 lb (56.2 kg)   BMI 19.42 kg/m    Physical Exam Vitals and nursing note reviewed.  Constitutional:      Appearance: She is well-developed.  HENT:     Head: Normocephalic and atraumatic.  Eyes:     Conjunctiva/sclera: Conjunctivae normal.  Cardiovascular:     Rate and Rhythm: Normal rate and regular rhythm.     Heart sounds: Normal heart sounds.  Pulmonary:     Effort: Pulmonary effort is normal.     Breath sounds: Normal breath sounds.  Abdominal:     General: Bowel sounds are normal.     Palpations: Abdomen is soft.  Musculoskeletal:     Cervical back: Normal range of motion.  Skin:    General: Skin is warm and dry.     Capillary Refill: Capillary refill takes less than 2 seconds.  Neurological:     Mental Status: She is alert and oriented to person, place, and time.  Psychiatric:        Behavior: Behavior normal.      Musculoskeletal Exam: C-spine, thoracic spine, lumbar spine have good range of motion.  No midline spinal tenderness or SI joint tenderness at this time.  Shoulder joints, elbow joints, wrist joints, MCPs, PIPs, DIPs have good range of motion with no synovitis.  Complete fist formation bilaterally.  Hip joints have good range of motion with no groin pain.  Knee joints have good range of motion with no warmth or effusion.  Ankle joints have good range of motion with some tenderness along the joint line and over the plantar fascia of both feet.  No tenderness over MTP joints.  CDAI Exam: CDAI Score: -- Patient Global: --; Provider Global: -- Swollen: --; Tender: -- Joint Exam 03/23/2022   No joint exam has been documented for this visit   There is currently no information documented on the homunculus. Go to the Rheumatology activity and complete the homunculus joint exam.  Investigation: No additional findings.  Imaging: No results found.  Recent Labs: Lab Results  Component Value Date   WBC 4.7 12/28/2021   HGB 11.4 (L)  12/28/2021   PLT 196 12/28/2021   NA 140 12/28/2021   K 3.9 12/28/2021   CL 108 12/28/2021   CO2 25 12/28/2021   GLUCOSE 92 12/28/2021   BUN 13 12/28/2021   CREATININE 0.71 12/28/2021   BILITOT 0.7 12/28/2021   ALKPHOS 47 07/23/2020   AST 16 12/28/2021   ALT 19 12/28/2021   PROT 6.5 12/28/2021   ALBUMIN 4.1 07/23/2020   CALCIUM 9.0 12/28/2021   QFTBGOLDPLUS NEGATIVE 04/30/2021    Speciality Comments: Diagnosed in [place].  Treated with Remicade, Humira and Simponi per patient.    Appointment every 3 months  Procedures:  No procedures performed Allergies: Patient has no known allergies.   Assessment / Plan:     Visit Diagnoses: Ankylosing spondylitis of multiple sites in spine [facility]): She presents today with symptoms consistent with plantar fasciitis of both feet.  She has been  experiencing increased pain in both feet for the past 1 month since increasing her activity level.  She has no synovitis or dactylitis on examination today.  No pustular psoriasis was noted.  C-spine, thoracic spine, lumbar spine have good range of motion with no discomfort or midline spinal tenderness.  She is not experiencing any SI joint discomfort at this time.  Her morning stiffness continues to last 2 to 3 hours but she has not had any nocturnal pain.  Overall her disease remains well controlled on Taltz 80 mg sq injections every 28 days, Rasuvo 20 mg sq injections once weekly, and prednisone 5 mg daily.  She continues to experience intermittent flares in her joints as well as due to uveitis.  She is under the care of her ophthalmologist who she sees frequently.  She requested work accommodations up to 3 flares per month lasting 2 days at a time. She will remain on the current treatment regimen.  A prednisone taper starting at 20 mg tapering by 5 mg every 4 days was sent to the pharmacy.  She will complete the taper and then continue on her maintenance dose of prednisone 5 mg daily.  She is aware of the  risks of long-term prednisone use.  She will follow-up in the office in 3 months or sooner if needed.  Panuveitis of both eyes: Followed closely by her ophthalmologist.  She continues to experience intermittent flares of uveitis.  She had left eye cataract surgery in May 2023 which did not have any complications.  She was encouraged to remain on the current treatment regimen with no interruptions in therapy.  Pustular psoriasis: Resolved.  She will remain on Taltz and Rasuvo as prescribed.  High risk medication use -Taltz 80 mg subcutaneous injections every 28 days, Rasuvo 20 mg subcutaneous injections once weekly, folic acid 2 mg daily, and prednisone 5 mg daily.   CBC and CMP drawn on 12/28/2021.  She is due to update lab work today.  Orders for CBC and CMP were released.  Her next lab work will be due in September and every 3 months to monitor for drug toxicity.  Standing orders for CBC and CMP remain in place.  TB Gold negative on 04/30/2021.  Future order for TB gold was placed today. She has not had any recent or recurrent infections.  Discussed the importance of holding Taltz and Rasuvo if she develops signs or symptoms of an infection and to resume once the infection is completely cleared. She has not had any new medical conditions. Plan: COMPLETE METABOLIC PANEL WITH GFR, CBC with Differential/Platelet, QuantiFERON-TB Gold Plus  Screening for tuberculosis -Future order for TB Gold placed today.  Plan: QuantiFERON-TB Gold Plus  Long term (current) use of systemic steroids: She remains on prednisone 5 mg daily.  She is aware of the risks of long-term prednisone use.  Chronic SI joint pain: No SI joint pain currently.  Chronic nonintractable headache, unspecified headache type  Myofascial pain: She experiences intermittent myalgias and muscle tenderness due to myofascial pain syndrome.  Overall her symptoms have been tolerable.  Other fatigue: She continues to experience fatigue  intermittently.  Plantar fasciitis, bilateral: She presents today with symptoms consistent with plantar fasciitis of both feet.  She has been experiencing increased pain in her heels for the past 1 month.  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  She has been dancing she relates which is likely exacerbating her symptoms.  Discussed the importance of wearing  proper fitting shoes for support. A prednisone taper starting at 20 mg tapering by 5 mg every 4 days was sent to the pharmacy today to treat her current flare of bilateral ankle pain and Achilles tendinitis.  She will remain on the current treatment regimen including Taltz and Rasuvo.  She was advised to notify us if she continues to have recurrent flares.  Orders: Orders Placed This Encounter  Procedures   COMPLETE METABOLIC PANEL WITH GFR   CBC with Differential/Platelet   QuantiFERON-TB Gold Plus   Meds ordered this encounter  Medications   predniSONE (DELTASONE) 5 MG tablet    Sig: Take 4 tablets by mouth daily x4 days, 3 tablets daily x4 days, 2 tablets daily x4 days, 1 tablet daily x4 days.    Dispense:  40 tablet    Refill:  0     Follow-Up Instructions: Return in about 3 months (around 06/23/2022) for Ankylosing Spondylitis.   Gearldine Bienenstock, PA-C  Note - This record has been created using Dragon software.  Chart creation errors have been sought, but may not always  have been located. Such creation errors do not reflect on  the standard of medical care.

## 2022-03-23 NOTE — Patient Instructions (Signed)
Standing Labs We placed an order today for your standing lab work.   Please have your standing labs drawn in September and every 3 months   If possible, please have your labs drawn 2 weeks prior to your appointment so that the provider can discuss your results at your appointment.  Please note that you may see your imaging and lab results in MyChart before we have reviewed them. We may be awaiting multiple results to interpret others before contacting you. Please allow our office up to 72 hours to thoroughly review all of the results before contacting the office for clarification of your results.  We have open lab daily: Monday through Thursday from 1:30-4:30 PM and Friday from 1:30-4:00 PM at the office of Dr. Shaili Deveshwar, [facility].   Please be advised, all patients with office appointments requiring lab work will take precedent over walk-in lab work.  If possible, please come for your lab work on Monday and Friday afternoons, as you may experience shorter wait times. The office is located at [address], [address], [place], [place] [address] No appointment is necessary.   Labs are drawn by [facility]. Please bring your co-pay at the time of your lab draw.  You may receive a bill from [facility] for your lab work.  Please note if you are on Hydroxychloroquine and and an order has been placed for a Hydroxychloroquine level, you will need to have it drawn 4 hours or more after your last dose.  If you wish to have your labs drawn at another location, please call the office 24 hours in advance to send orders.  If you have any questions regarding directions or hours of operation,  please call [phone].   As a reminder, please drink plenty of water prior to coming for your lab work. Thanks!  

## 2022-03-24 ENCOUNTER — Telehealth: Payer: Self-pay | Admitting: *Deleted

## 2022-03-24 LAB — COMPLETE METABOLIC PANEL WITH GFR
AG Ratio: 1.7 (calc) (ref 1.0–2.5)
ALT: 17 U/L (ref 6–29)
AST: 18 U/L (ref 10–30)
Albumin: 4.4 g/dL (ref 3.6–5.1)
Alkaline phosphatase (APISO): 38 U/L (ref 31–125)
BUN: 11 mg/dL (ref 7–25)
CO2: 24 mmol/L (ref 20–32)
Calcium: 9.4 mg/dL (ref 8.6–10.2)
Chloride: 107 mmol/L (ref 98–110)
Creat: 0.83 mg/dL (ref 0.50–0.97)
Globulin: 2.6 g/dL (calc) (ref 1.9–3.7)
Glucose, Bld: 75 mg/dL (ref 65–99)
Potassium: 3.7 mmol/L (ref 3.5–5.3)
Sodium: 139 mmol/L (ref 135–146)
Total Bilirubin: 0.9 mg/dL (ref 0.2–1.2)
Total Protein: 7 g/dL (ref 6.1–8.1)
eGFR: 97 mL/min/{1.73_m2} (ref >=60)

## 2022-03-24 LAB — CBC WITH DIFFERENTIAL/PLATELET
Absolute Monocytes: 260 cells/uL (ref 200–950)
Basophils Absolute: 31 cells/uL (ref 0–200)
Basophils Relative: 0.7 %
Eosinophils Absolute: 150 cells/uL (ref 15–500)
Eosinophils Relative: 3.4 %
HCT: 36.3 % (ref 35.0–45.0)
Hemoglobin: 12.2 g/dL (ref 11.7–15.5)
Lymphs Abs: 1558 cells/uL (ref 850–3900)
MCH: 29.7 pg (ref 27.0–33.0)
MCHC: 33.6 g/dL (ref 32.0–36.0)
MCV: 88.3 fL (ref 80.0–100.0)
MPV: 10.4 fL (ref 7.5–12.5)
Monocytes Relative: 5.9 %
Neutro Abs: 2402 cells/uL (ref 1500–7800)
Neutrophils Relative %: 54.6 %
Platelets: 231 10*3/uL (ref 140–400)
RBC: 4.11 10*6/uL (ref 3.80–5.10)
RDW: 12.6 % (ref 11.0–15.0)
Total Lymphocyte: 35.4 %
WBC: 4.4 10*3/uL (ref 3.8–10.8)

## 2022-03-24 NOTE — Progress Notes (Signed)
CBC and CMP WNL

## 2022-03-24 NOTE — Telephone Encounter (Signed)
Spoke with patient and advised we do not have a blank set of FMLA paperwork for [place] to be able to complete. Patient advised we will need this in order to complete the form for her. Patient states she will drop off the form on 03/28/2022. Patient advised I will complete the paperwork after I return to the office on 03/30/2022. Patient expressed understanding.

## 2022-03-28 ENCOUNTER — Other Ambulatory Visit (HOSPITAL_COMMUNITY): Payer: Self-pay

## 2022-04-18 ENCOUNTER — Telehealth: Payer: Self-pay | Admitting: Pharmacy Technician

## 2022-04-18 ENCOUNTER — Other Ambulatory Visit (HOSPITAL_COMMUNITY): Payer: Self-pay

## 2022-04-18 NOTE — Telephone Encounter (Signed)
Submitted a Prior Authorization request to [facility] for TALTZ via CoverMyMeds. Will update once we receive a response.   VVZ:[id number]

## 2022-04-19 ENCOUNTER — Other Ambulatory Visit (HOSPITAL_COMMUNITY): Payer: Self-pay

## 2022-04-19 NOTE — Telephone Encounter (Signed)
Received notification from  [employer]  regarding a prior authorization for TALTZ. Authorization has been APPROVED from 04/18/22 to 04/19/23. Insurance covers 3 per 84 days.  Patient can continue to fill through [facility]: [phone]   Phone # [phone]  Chesley Mires, PharmD, MPH, BCPS, CPP Clinical Pharmacist (Rheumatology and Pulmonology)

## 2022-04-22 ENCOUNTER — Emergency Department (HOSPITAL_COMMUNITY)
Admission: EM | Admit: 2022-04-22 | Discharge: 2022-04-23 | Disposition: A | Discharge status: Home / Self Care | Payer: Medicaid Other | Attending: Emergency Medicine | Admitting: Emergency Medicine

## 2022-04-22 ENCOUNTER — Other Ambulatory Visit: Payer: Self-pay

## 2022-04-22 ENCOUNTER — Encounter (HOSPITAL_COMMUNITY): Payer: Self-pay | Admitting: Emergency Medicine

## 2022-04-22 DIAGNOSIS — R102 Pelvic and perineal pain: Secondary | ICD-10-CM | POA: Diagnosis present

## 2022-04-22 DIAGNOSIS — N3000 Acute cystitis without hematuria: Secondary | ICD-10-CM

## 2022-04-22 LAB — URINALYSIS, ROUTINE W REFLEX MICROSCOPIC
Bilirubin Urine: NEGATIVE
Glucose, UA: NEGATIVE mg/dL
Hgb urine dipstick: NEGATIVE
Ketones, ur: NEGATIVE mg/dL
Nitrite: NEGATIVE
Protein, ur: NEGATIVE mg/dL
Specific Gravity, Urine: 1.013 (ref 1.005–1.030)
pH: 6 (ref 5.0–8.0)

## 2022-04-22 LAB — PREGNANCY, URINE: Preg Test, Ur: NEGATIVE

## 2022-04-22 NOTE — ED Triage Notes (Signed)
Patient reports urinary frequency with cloudy urine , dysuria and vaginal pain onset today , no fever or chills.

## 2022-04-23 MED ORDER — FLUCONAZOLE 150 MG PO TABS: 150.0000 mg | ORAL_TABLET | Freq: Once | ORAL | 0 refills | Status: AC

## 2022-04-23 MED ORDER — PHENAZOPYRIDINE HCL 100 MG PO TABS
95.0000 mg | ORAL_TABLET | Freq: Once | ORAL | Status: AC
Start: 2022-04-23 — End: 2022-04-23
  Administered 2022-04-23: 100 mg via ORAL
  Filled 2022-04-23: qty 1

## 2022-04-23 MED ORDER — CEPHALEXIN 500 MG PO CAPS: 500.0000 mg | ORAL_CAPSULE | Freq: Two times a day (BID) | ORAL | 0 refills | Status: AC

## 2022-04-23 MED ORDER — CEPHALEXIN 250 MG PO CAPS
500.0000 mg | ORAL_CAPSULE | Freq: Two times a day (BID) | ORAL | Status: AC
  Administered 2022-04-23: 500 mg via ORAL
  Filled 2022-04-23: qty 2

## 2022-04-23 NOTE — ED Provider Notes (Signed)
[facility] EMERGENCY DEPARTMENT Provider Note   CSN: [id number] Arrival date & time: 04/22/22  2125     History  Chief Complaint  Patient presents with   Urinary Tract Infection    Bethany Li is a 31 y.o. female.  31 y/o presenting for dysuria onset today with associated frequency, cloudy urine, vaginal pain. Reports symptoms feel c/w prior UTIs. Last took abx for UTI > 1 year ago. Believe symptom onset was related to recent sexual intercourse as patient did not void until the morning after. Denies fevers, chills, vomiting.   Urinary Tract Infection      Home Medications Prior to Admission medications   Medication Sig Start Date End Date Taking? Authorizing Provider  cephALEXin (KEFLEX) 500 MG capsule Take 1 capsule (500 mg total) by mouth 2 (two) times daily for 7 days. 04/23/22 04/30/22 Yes Antony Madura, PA-C  fluconazole (DIFLUCAN) 150 MG tablet Take 1 tablet (150 mg total) by mouth once for 1 dose. Take upon completion of antibiotics if you develop symptoms of a yeast infection. 04/23/22 04/23/22 Yes Antony Madura, PA-C  ALPHAGAN P 0.1 % SOLN Apply 1 drop to eye 3 (three) times daily. 08/31/21   [provider]  folic acid (FOLVITE) 1 MG tablet Take 2 tablets (2 mg total) by mouth daily. 04/30/21   Pollyann Savoy, MD  Ixekizumab (TALTZ) 80 MG/ML SOAJ Inject 80 mg into the skin every 28 (twenty-eight) days. 01/04/22   Gearldine Bienenstock, PA-C  ketorolac (ACULAR) 0.5 % ophthalmic solution 1 drop 3 (three) times daily. 08/31/21   [provider]  Methotrexate, PF, (RASUVO) 20 MG/0.4ML SOAJ Inject 20 mg into the skin once a week. Store at room temperature. 01/04/22   Gearldine Bienenstock, PA-C  norethindrone-ethinyl estradiol-FE (LOESTRIN FE) 1-20 MG-MCG tablet     [provider]  prednisoLONE acetate (PRED FORTE) 1 % ophthalmic suspension 1 drop 3 (three) times daily.    [provider]  predniSONE (DELTASONE) 5 MG tablet TAKE 1  TABLET BY MOUTH EVERY DAY WITH BREAKFAST 02/16/22   Gearldine Bienenstock, PA-C  predniSONE (DELTASONE) 5 MG tablet Take 4 tablets by mouth daily x4 days, 3 tablets daily x4 days, 2 tablets daily x4 days, 1 tablet daily x4 days. 03/23/22   Gearldine Bienenstock, PA-C      Allergies    Patient has no known allergies.    Review of Systems   Review of Systems Ten systems reviewed and are negative for acute change, except as noted in the HPI.    Physical Exam Updated Vital Signs BP 114/81   Pulse 77   Temp 98.6 F (37 C) (Oral)   Resp 16   LMP 04/18/2022   SpO2 100%   Physical Exam Vitals and nursing note reviewed.  Constitutional:      General: She is not in acute distress.    Appearance: She is well-developed. She is not diaphoretic.  HENT:     Head: Normocephalic and atraumatic.  Eyes:     General: No scleral icterus.    Conjunctiva/sclera: Conjunctivae normal.  Pulmonary:     Effort: Pulmonary effort is normal. No respiratory distress.  Abdominal:     General: There is no distension.  Musculoskeletal:        General: Normal range of motion.     Cervical back: Normal range of motion.  Skin:    General: Skin is warm and dry.     Coloration: Skin is not pale.  Findings: No erythema or rash.  Neurological:     Mental Status: She is alert and oriented to person, place, and time.  Psychiatric:        Behavior: Behavior normal.     ED Results / Procedures / Treatments   Labs (all labs ordered are listed, but only abnormal results are displayed) Labs Reviewed  URINALYSIS, ROUTINE W REFLEX MICROSCOPIC - Abnormal; Notable for the following components:      Result Value   APPearance HAZY (*)    Leukocytes,Ua SMALL (*)    Bacteria, UA RARE (*)    All other components within normal limits  URINE CULTURE  PREGNANCY, URINE    EKG None  Radiology No results found.  Procedures Procedures    Medications Ordered in ED Medications  cephALEXin (KEFLEX) capsule 500 mg (500 mg  Oral Given 04/23/22 0101)  phenazopyridine (PYRIDIUM) tablet 100 mg (100 mg Oral Given 04/23/22 0100)    ED Course/ Medical Decision Making/ A&P                           Medical Decision Making Amount and/or Complexity of Data Reviewed Labs: ordered.  Risk Prescription drug management.   This patient presents to the ED for concern of dysuria, this involves an extensive number of treatment options, and is a complaint that carries with it a high risk of complications and morbidity.  The differential diagnosis includes uncomplicated UTI vs pyelonephritis vs interstitial cystitis vs STI   Co morbidities that complicate the patient evaluation  None    Lab Tests:  I Ordered, and personally interpreted labs.  The pertinent results include:  Pyuria with 21-50 WBCs   Cardiac Monitoring:  The patient was maintained on a cardiac monitor.  I personally viewed and interpreted the cardiac monitored which showed an underlying rhythm of: NSR   Medicines ordered and prescription drug management:  I ordered medication including Keflex for UTI and pyridium for pain  Reevaluation of the patient after these medicines showed that the patient stayed the same I have reviewed the patients home medicines and have made adjustments as needed   Test Considered:  Wet prep GC/Chlamydia   Critical Interventions:  Initiation of abx for UTI   Reevaluation:  After the interventions noted above, I reevaluated the patient and found that they have : remained stable   Social Determinants of Health:  Insured    Dispostion:  After consideration of the diagnostic results and the patients response to treatment, I feel that the patent would benefit from outpatient course of Keflex for tx of UTI. Urine culture pending. Able to f/u with PCP for recheck PRN. Return precautions discussed and provided. Patient discharged in stable condition with no unaddressed concerns.          Final Clinical  Impression(s) / ED Diagnoses Final diagnoses:  Acute cystitis without hematuria    Rx / DC Orders ED Discharge Orders          Ordered    cephALEXin (KEFLEX) 500 MG capsule  2 times daily        04/23/22 0021    fluconazole (DIFLUCAN) 150 MG tablet   Once        04/23/22 0021              Antony Madura, PA-C 04/23/22 1815    Tilden Fossa, MD 04/23/22 2310

## 2022-04-23 NOTE — ED Notes (Signed)
Patient verbalizes understanding of discharge instructions. Opportunity for questioning and answers were provided. Armband removed by staff, pt discharged from ED ambulatory.   

## 2022-04-25 LAB — URINE CULTURE: Culture: >=100000 — AB

## 2022-04-26 ENCOUNTER — Telehealth: Payer: Self-pay

## 2022-04-26 NOTE — Telephone Encounter (Signed)
Post ED Visit - Positive Culture Follow-up  Culture report reviewed by antimicrobial stewardship pharmacist: [facility] []  , Pharm.D. [x]  Enzo Bi, Pharm.D., BCPS AQ-ID []  , Pharm.D., BCPS []  Celedonio Miyamoto, Pharm.D., BCPS []  [place], Garvin Fila.D., BCPS, AAHIVP []  , Pharm.D., BCPS, AAHIVP []  Georgina Pillion, PharmD, BCPS []  , PharmD, BCPS []  [place], PharmD, BCPS []  [place], PharmD []  , PharmD, BCPS []  Estella Husk, PharmD  Pharmacy Team []  Lysle Pearl, PharmD []  , PharmD []  Phillips Climes, PharmD []  , Rph []  Agapito Games) , PharmD []  Verlan Friends, PharmD []  , PharmD []  Mervyn Gay, PharmD []  , PharmD []  Vinnie Level, PharmD []  W[facility], PharmD []  , PharmD []  Len Childs, PharmD   Positive urine culture Treated with Cephalexin and Fluconazole, organism sensitive to the same and no further patient follow-up is required at this time.  04/26/2022, 9:57 AM

## 2022-04-27 ENCOUNTER — Other Ambulatory Visit: Payer: Self-pay | Admitting: Physician Assistant

## 2022-04-27 ENCOUNTER — Other Ambulatory Visit (HOSPITAL_COMMUNITY): Payer: Self-pay

## 2022-04-27 DIAGNOSIS — M450 Ankylosing spondylitis of multiple sites in spine: Secondary | ICD-10-CM

## 2022-04-27 MED ORDER — TALTZ 80 MG/ML ~~LOC~~ SOAJ
80.0000 mg | SUBCUTANEOUS | 0 refills | Status: DC
  Filled 2022-04-27: qty 3, 84d supply, fill #0

## 2022-04-27 MED ORDER — RASUVO 20 MG/0.4ML ~~LOC~~ SOAJ
20.0000 mg | SUBCUTANEOUS | 0 refills | Status: DC
  Filled 2022-04-27: qty 4.8, 84d supply, fill #0

## 2022-04-27 NOTE — Telephone Encounter (Signed)
TB gold is due.

## 2022-04-27 NOTE — Telephone Encounter (Signed)
Please schedule patient a follow up visit. Patient due September 2023. Thanks!  

## 2022-04-27 NOTE — Telephone Encounter (Signed)
Next Visit: Due September 2023. Message sent to the front to schedule.   Last Visit: 03/23/2022  Last Fill: 01/04/2022  DX: Ankylosing spondylitis of multiple sites in spine   Current Dose per office note 03/23/2022: Taltz 80 mg sq injections every 28 days, Rasuvo 20 mg sq injections once weekly  Labs: 03/23/2022 CBC and CMP WNL  TB Gold: 04/30/2021 Neg    Okay to refill Taltz and Rasuvo?

## 2022-04-27 NOTE — Telephone Encounter (Signed)
LMOM for patient to call and schedule follow-up appointment.   °

## 2022-04-28 ENCOUNTER — Other Ambulatory Visit (HOSPITAL_COMMUNITY): Payer: Self-pay

## 2022-05-19 ENCOUNTER — Other Ambulatory Visit (HOSPITAL_COMMUNITY): Payer: Self-pay

## 2022-06-10 ENCOUNTER — Encounter: Payer: Self-pay | Admitting: Pharmacist

## 2022-06-10 NOTE — Telephone Encounter (Signed)
Opened in error

## 2022-06-15 ENCOUNTER — Telehealth: Payer: Self-pay | Admitting: *Deleted

## 2022-06-15 NOTE — Telephone Encounter (Signed)
Patient contacted the office and left message requesting a return call regarding her accommodations paperwork.  Patient stated her employer received her accomodation paperwork but it did not cover some of the tardies or absences she has had. Patient is requesting to have the paperwork amended.     Returned call to the patient and advised her accommodation paperwork gives her 3 episodes per month with 2 days per episode. Patient advised we will not be able to cover anything above and beyond that. Patient advised she will need to reach out to her PCP as they may be able to cover that time. Patient expressed understanding. Patient made the comment as she was hanging up the phone "I don't think they or understand. I don't think they care."

## 2022-06-18 IMAGING — DX DG SHOULDER 2+V*L*
3 series · 3 of 3 positions shown · non-contrast
Comparison: None.

CLINICAL DATA: Pain following fall

EXAM:
LEFT SHOULDER - 2+ VIEW

[shoulder grashey]
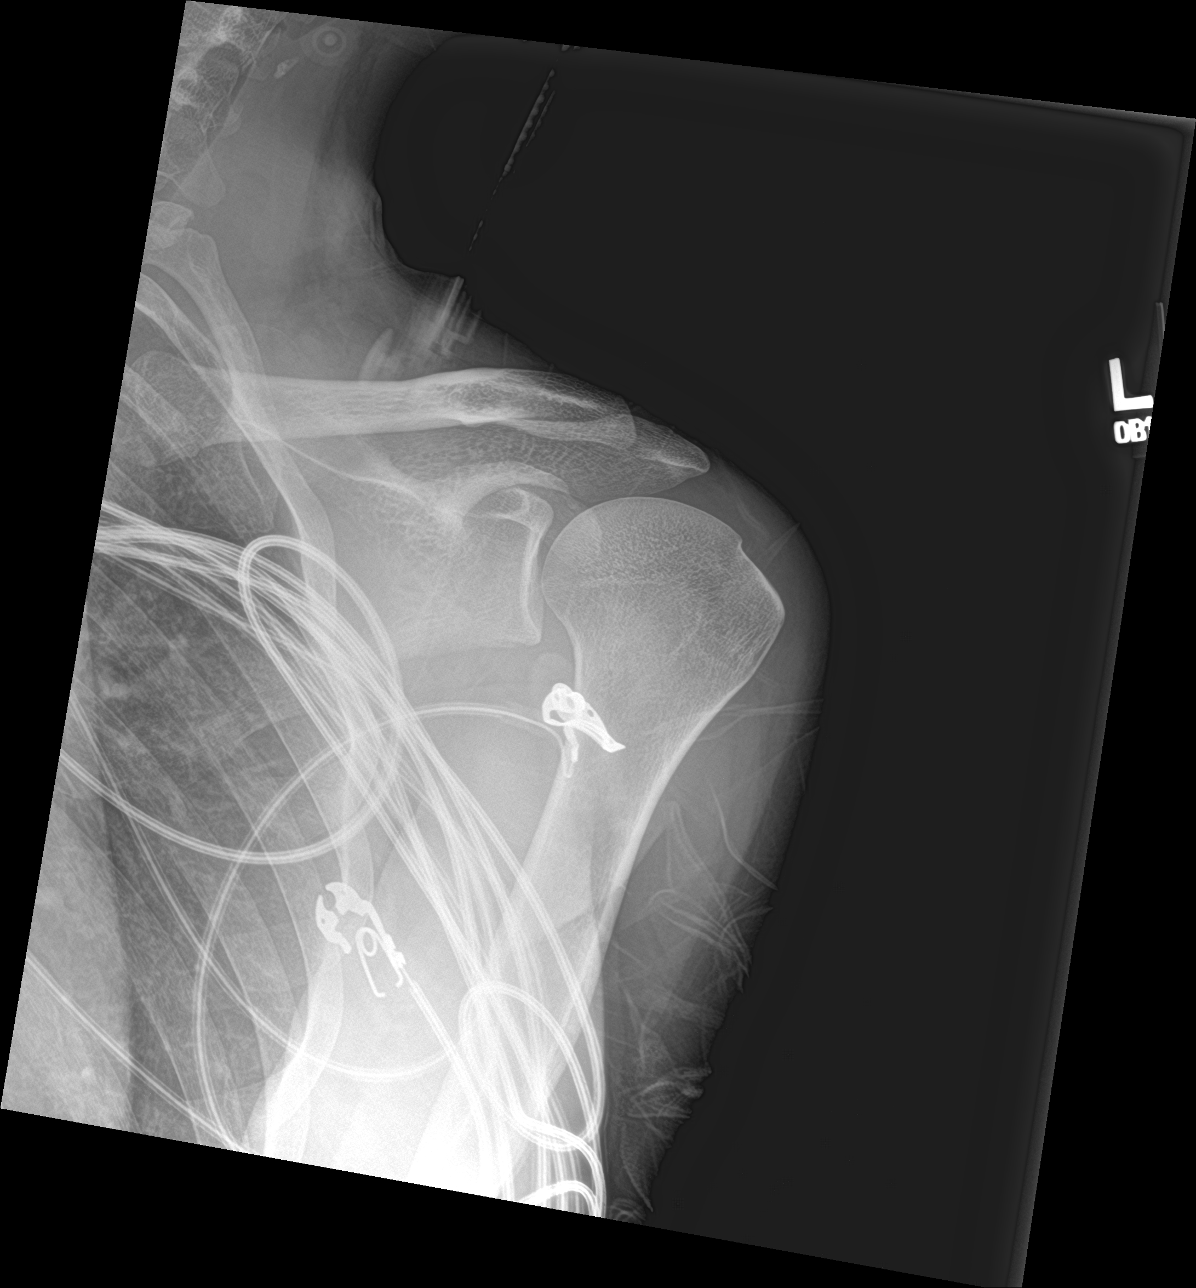

[shoulder y view]
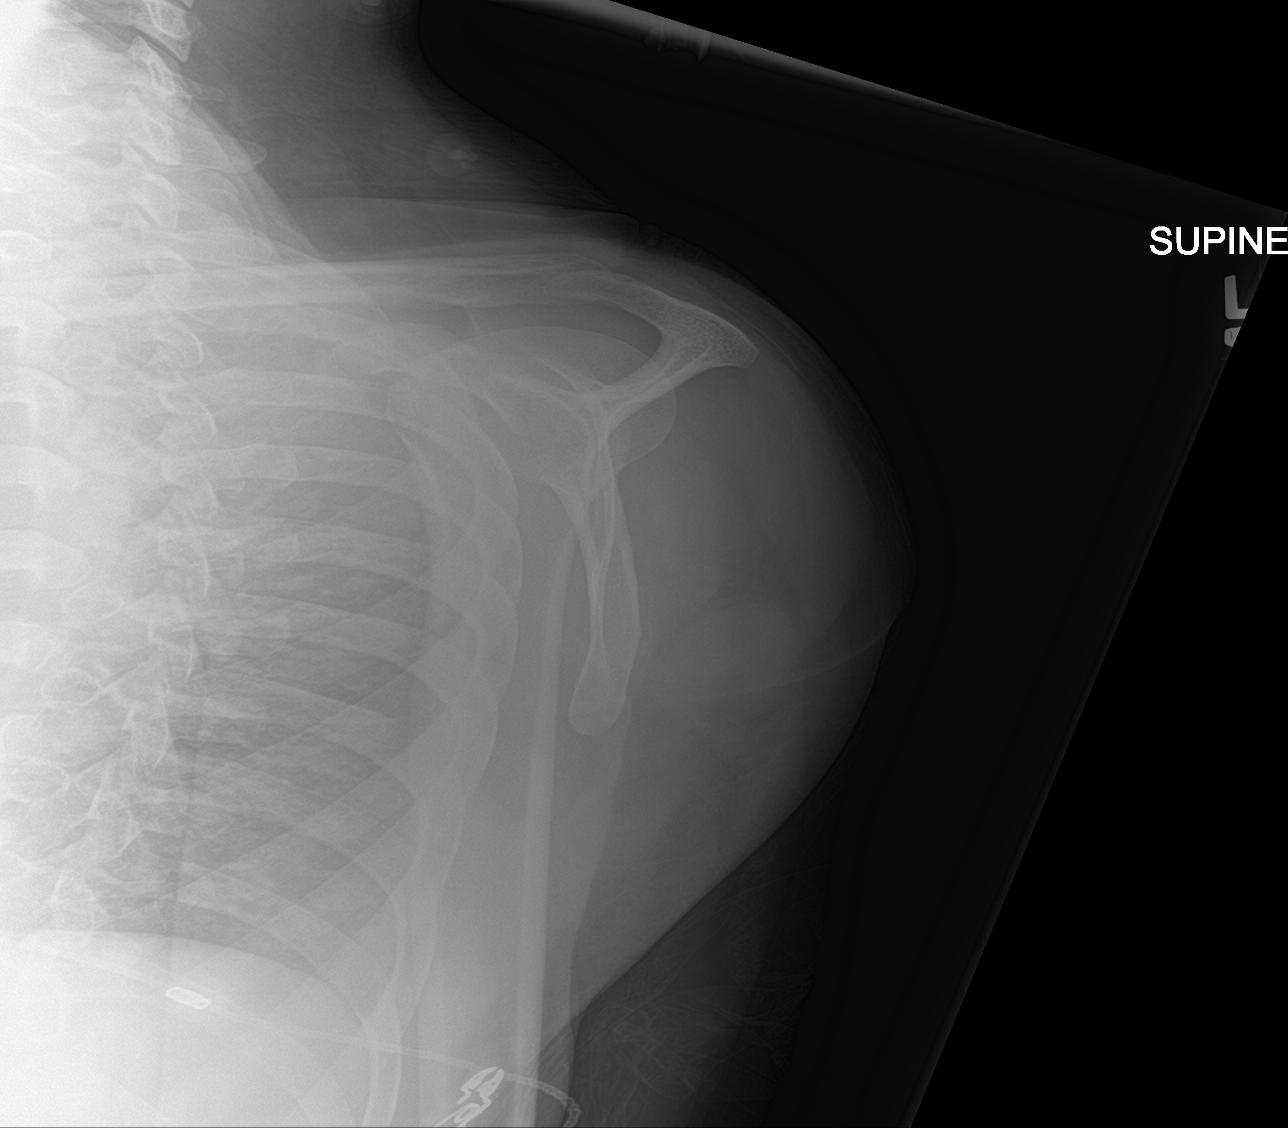

[shoulder ap neutral]
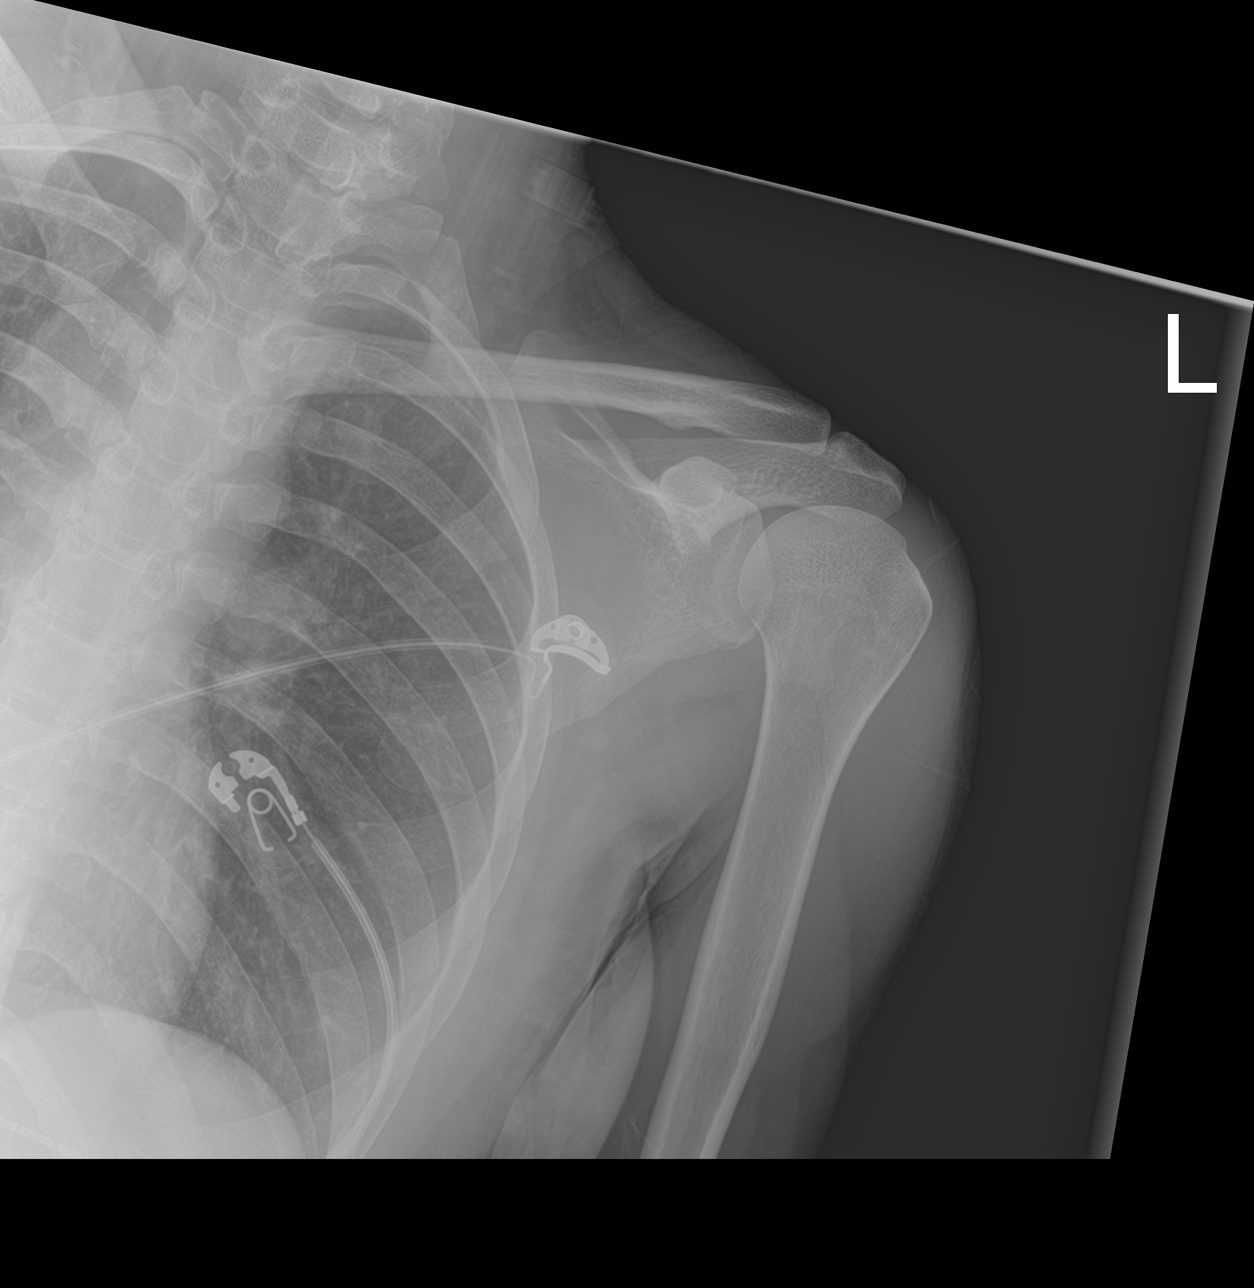

[3 of 3 positions shown; findings below may reference images not displayed]

FINDINGS: Frontal, oblique, and Y scapular images were obtained. No fracture
or dislocation. Joint spaces appear normal. No erosive change.
Visualized left lung clear.
IMPRESSION: No fracture or dislocation.  No evident arthropathy.

## 2022-06-18 IMAGING — DX DG PELVIS 1-2V
1 series · 1 of 1 positions shown · non-contrast
Comparison: None.

CLINICAL DATA: Pain following fall

EXAM:
PELVIS - 1-2 VIEW

[pelvis ap]
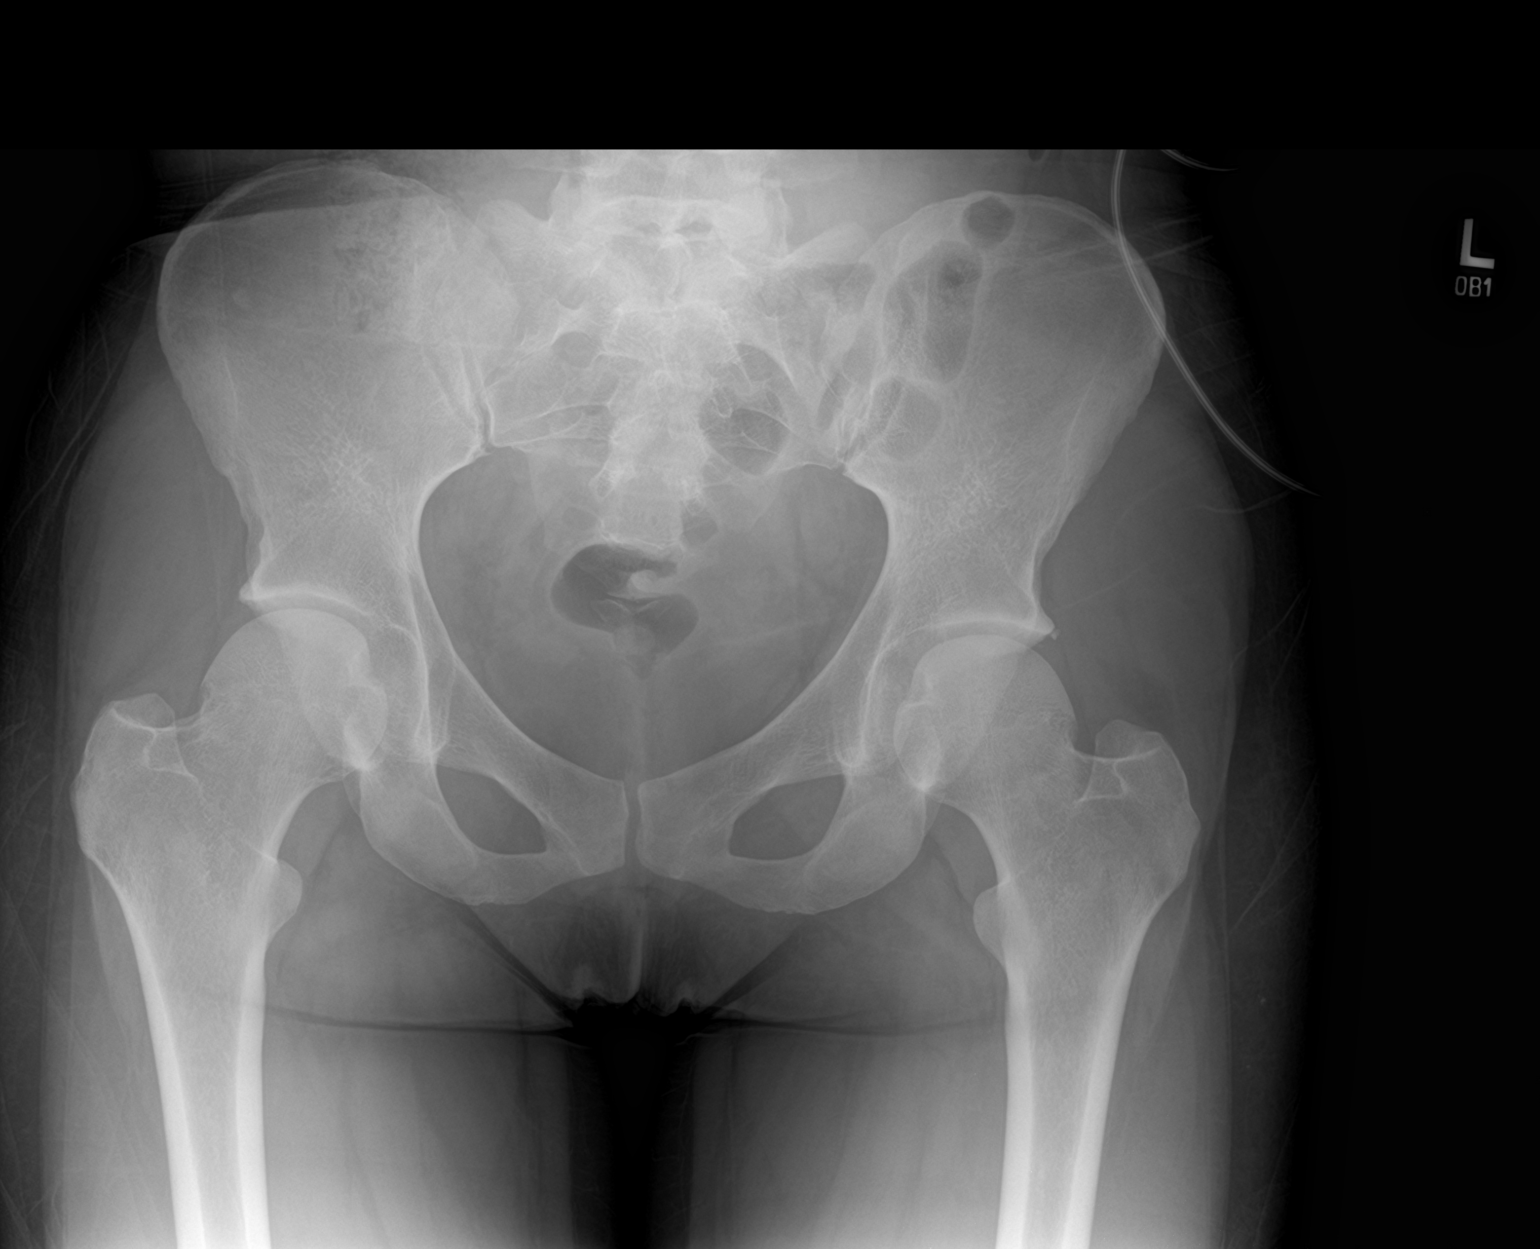

[1 of 1 positions shown; findings below may reference images not displayed]

FINDINGS: No fracture or dislocation. Joint spaces appear normal. No erosive
change.
IMPRESSION: No fracture or dislocation.  No evident arthropathy.

## 2022-08-04 ENCOUNTER — Other Ambulatory Visit (HOSPITAL_COMMUNITY): Payer: Self-pay

## 2022-08-04 ENCOUNTER — Other Ambulatory Visit: Payer: Self-pay | Admitting: Physician Assistant

## 2022-08-04 DIAGNOSIS — M450 Ankylosing spondylitis of multiple sites in spine: Secondary | ICD-10-CM

## 2022-09-21 ENCOUNTER — Other Ambulatory Visit (HOSPITAL_COMMUNITY): Payer: Self-pay

## 2022-10-07 ENCOUNTER — Other Ambulatory Visit (HOSPITAL_COMMUNITY): Payer: Self-pay

## 2022-10-26 NOTE — Progress Notes (Unsigned)
Office Visit Note  Patient: Bethany Li             Date of Birth: 12/28/1990           MRN: [id number]             PCP: Benito Mccreedy, MD Referring: Benito Mccreedy, MD Visit Date: 11/09/2022 Occupation: @GUAROCC$ @  Subjective:  Discuss medication options   History of Present Illness: Bethany Li is a 32 y.o. female with history of ankylosing spondylitis.  Patient is currently prescribed Taltz 80 mg subcutaneous injections every 28 days, Rasuvo 20 mg subcutaneous injections once weekly, folic acid 2 mg daily, and prednisone 5 mg daily.  Patient reports that she discontinued rasuvo 2 weeks ago.  Patient reports that she is planning pregnancy in the future.  She had a follow-up visit with her OB/GYN recently.  She is not currently taking any contraception.  Patient would like to discuss treatment options that will be safe during pregnancy.   Activities of Daily Living:  Patient reports morning stiffness daily.   Patient Denies nocturnal pain.  Difficulty dressing/grooming: Denies Difficulty climbing stairs: Denies Difficulty getting out of chair: Denies Difficulty using hands for taps, buttons, cutlery, and/or writing: Reports  Review of Systems  Constitutional: Negative.  Negative for fatigue.  HENT: Negative.  Negative for mouth sores and mouth dryness.   Eyes: Negative.  Negative for dryness.  Respiratory: Negative.  Negative for shortness of breath.   Cardiovascular: Negative.  Negative for chest pain and palpitations.  Gastrointestinal: Negative.  Negative for blood in stool, constipation and diarrhea.  Endocrine: Negative.  Negative for increased urination.  Genitourinary: Negative.  Negative for involuntary urination.  Musculoskeletal:  Positive for joint pain, joint pain, myalgias, muscle weakness, morning stiffness, muscle tenderness and myalgias. Negative for gait problem and joint swelling.  Skin: Negative.  Negative for color change, rash, hair loss  and sensitivity to sunlight.  Allergic/Immunologic: Positive for susceptible to infections.  Neurological:  Positive for headaches. Negative for dizziness.  Hematological: Negative.  Negative for swollen glands.  Psychiatric/Behavioral:  Positive for sleep disturbance. Negative for depressed mood. The patient is not nervous/anxious.     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   Objective: Vital Signs: BP 115/83 (BP Location: Left Arm, Patient Position: Sitting, Cuff Size: Normal)   Pulse 80   Ht 5' 6"$  (1.676 m)   Wt 126 lb 9.6 oz (57.4 kg)   LMP 10/31/2022   BMI 20.43 kg/m    Physical Exam Vitals and nursing note reviewed.  Constitutional:      Appearance: She is well-developed.  HENT:     Head: Normocephalic and atraumatic.  Eyes:  Conjunctiva/sclera: Conjunctivae normal.  Cardiovascular:     Rate and Rhythm: Normal rate and regular rhythm.     Heart sounds: Normal heart sounds.  Pulmonary:     Effort: Pulmonary effort is normal.     Breath sounds: Normal breath sounds.  Abdominal:     General: Bowel sounds are normal.     Palpations: Abdomen is soft.  Musculoskeletal:     Cervical back: Normal range of motion.  Skin:    General: Skin is warm and dry.     Capillary Refill: Capillary refill takes less than  2 seconds.  Neurological:     Mental Status: She is alert and oriented to person, place, and time.  Psychiatric:        Behavior: Behavior normal.      Musculoskeletal Exam: C-spine, thoracic spine, and lumbar spine good ROM.  No midline spinal tenderness.  No SI joint tenderness.  Shoulder joints, elbow joints, wrist joints, MCPs, PIPs, DIPs have good range of motion with no synovitis.  Complete fist formation bilaterally.  Hip joints have good range of motion with no groin pain.  Knee joints have good range of motion with no warmth or effusion.  Ankle joints have good range of motion with no tenderness or joint swelling.  No evidence of achilles tendonitis or plantar fasciitis.    CDAI Exam: CDAI Score: -- Patient Global: --; Provider Global: -- Swollen: --; Tender: -- Joint Exam 11/09/2022   No joint exam has been documented for this visit   There is currently no information documented on the homunculus. Go to the Rheumatology activity and complete the homunculus joint exam.  Investigation: No additional findings.  Imaging: No results found.  Recent Labs: Lab Results  Component Value Date   WBC 4.4 03/23/2022   HGB 12.2 03/23/2022   PLT 231 03/23/2022   NA 139 03/23/2022   K 3.7 03/23/2022   CL 107 03/23/2022   CO2 24 03/23/2022   GLUCOSE 75 03/23/2022   BUN 11 03/23/2022   CREATININE 0.83 03/23/2022   BILITOT 0.9 03/23/2022   ALKPHOS 47 07/23/2020   AST 18 03/23/2022   ALT 17 03/23/2022   PROT 7.0 03/23/2022   ALBUMIN 4.1 07/23/2020   CALCIUM 9.4 03/23/2022   QFTBGOLDPLUS NEGATIVE 04/30/2021    Speciality Comments: Diagnosed in [place].  Treated with Remicade, Humira and Simponi per patient.    Appointment every 3 months  Procedures:  No procedures performed Allergies: Patient has no known allergies.    Assessment / Plan:     Visit Diagnoses: Ankylosing spondylitis of multiple sites in spine [facility]): Patient presents today to discuss medication options  and she is currently family-planning.  She has been on Taltz 80 mg sq injections every 28 days and Rasuvo 20 mg sq injections once weekly.  Her last dose of Donnetta Hail was administered at the end of January 2024.  Her last dose of Rasuvo was administered 2 weeks ago.  She has been evaluated by her OB/GYN who recommended following up with [place] to discuss medication alternatives.  She is not currently taking oral contraception.  Discussed that she should be off of methotrexate at least 3 months prior to conception.  Recommend taking oral contraceptive pill for the next 3 months as a washout period for methotrexate.  Discussed medication alternatives to Donnetta Hail which are FDA approved for the use during pregnancy: Cimzia.  Indications, contraindications, potential side effects of Cimzia were discussed today in detail.  All questions were addressed and  consent was obtained.  We we will apply for Cimzia through her insurance and once approved she will return to the office for administration of the first injection at the end of February 2024.  She will follow up in 2 months or sooner if needed.   Counseled patient that Cimzia is a TNF blocking agent.  Counseled patient on purpose, proper use, and adverse effects of Cimzia.  Reviewed the most common adverse effects including infections, headache, and injection site reactions. Discussed that there is the possibility of an increased risk of malignancy including non-melanoma skin cancer but it is not well understood if this increased risk is due to the medication or the disease state.  Advised patient to get yearly dermatology exams due to risk of skin cancer.  Counseled patient that Cimzia should be held prior to scheduled surgery.  Counseled patient to avoid live vaccines while on Cimzia.  Recommend annual influenza, PCV 15 or PCV20 or Pneumovax 23, and Shingrix as indicated.   Reviewed the importance of regular labs while on Cimzia therapy. Will monitor CBC and CMP 1 month after  starting and then every 3 months routinely thereafter. Will monitor TB gold annually. Standing orders placed. Provided patient with medication education material and answered all questions.  Patient voiced understanding.  Patient consented to Cimzia.  Will upload consent into the media tab.  Reviewed storage instructions for Cimzia.  Will apply for Cimzia through patient's insurance.  Advised initial injection must be administered in office.    Dose will be Cimzia 400 mg at weeks 0,2,4 then Cimzia 400 mg every 28 days  Prescription pending labs and insurance approval.  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    Pustular psoriasis: No active pustular psoriasis lesions.   High risk medication use -Patient is currently family planning.  She is taking Prednisone 5 mg 1 tablet by mouth daily.  Patient is aware of the long term risks of prednisone use. Plan to try tapering prednisone once she has initiated on cimzia.   Patient has been off of methotrexate for 2 weeks.  She is not currently on contraception.  Advised patient to start contraception for the next 3 months to allow for washout of methotrexate for the next 3 months.   Plan to switch the patient from [employer] to Cimzia which is safe during pregnancy and while breast-feeding. Ok to initiate Cimzia once approved on or after the date when the next dose of Donnetta Hail would be due.  CBC and CMP were drawn on 03/23/2022.  Orders for CBC and CMP were released today.  She will require updated lab work in 1 month then every 3 months.   TB Gold negative on 04/30/21.  Order for TB gold released today.  Previous therapy: Humira-developed psoriasis.  No recent or recurrent infections.  Discussed the importance of holding Cimzia if she develops signs or symptoms of an infection and to resume once the infection has completely cleared.  - Plan: CBC with Differential/Platelet, COMPLETE METABOLIC PANEL WITH GFR, QuantiFERON-TB Gold Plus  Screening for  tuberculosis - TB gold order released.  Plan: QuantiFERON-TB Gold Plus  Long term (current) use of systemic steroids: She remains on prednisone 5 mg 1 tablet by mouth daily. Plan to taper prednisone once she has initiated cimzia.  Discouraged the use of prednisone while pregnant.   Chronic SI joint pain: No SI joint tenderness upon palpation today.   Myofascial pain: She continues to experience intermittent myalgias and muscle tenderness due to  myofascial pain.  Overall her symptoms have been tolerable.  Other fatigue: Stable.    Plantar fasciitis, bilateral: Resolved.   Other medical conditions are listed as follows:   Chronic nonintractable headache, unspecified headache type  Orders: Orders Placed This Encounter  Procedures   CBC with Differential/Platelet   COMPLETE METABOLIC PANEL WITH GFR   QuantiFERON-TB Gold Plus   Ambulatory referral to Dermatology   Meds ordered this encounter  Medications   predniSONE (DELTASONE) 5 MG tablet    Sig: TAKE 1 TABLET BY MOUTH EVERY DAY WITH BREAKFAST    Dispense:  90 tablet    Refill:  0    Follow-Up Instructions: Return in about 2 months (around 01/08/2023) for Ankylosing Spondylitis.   Ofilia Neas, PA-C  Note - This record has been created using Dragon software.  Chart creation errors have been sought, but may not always  have been located. Such creation errors do not reflect on  the standard of medical care.

## 2022-11-03 ENCOUNTER — Encounter: Payer: Self-pay | Admitting: Obstetrics and Gynecology

## 2022-11-03 ENCOUNTER — Ambulatory Visit (INDEPENDENT_AMBULATORY_CARE_PROVIDER_SITE_OTHER): Payer: Medicaid Other | Admitting: Obstetrics and Gynecology

## 2022-11-03 ENCOUNTER — Other Ambulatory Visit (HOSPITAL_COMMUNITY)
Admission: RE | Admit: 2022-11-03 | Discharge: 2022-11-03 | Disposition: A | Discharge status: Home / Self Care | Payer: Medicaid Other | Source: Ambulatory Visit | Attending: Obstetrics and Gynecology | Admitting: Obstetrics and Gynecology

## 2022-11-03 VITALS — BP 127/83 | HR 81 | Ht 66.0 in | Wt 127.0 lb

## 2022-11-03 DIAGNOSIS — Z113 Encounter for screening for infections with a predominantly sexual mode of transmission: Secondary | ICD-10-CM

## 2022-11-03 DIAGNOSIS — Z8742 Personal history of other diseases of the female genital tract: Secondary | ICD-10-CM | POA: Insufficient documentation

## 2022-11-03 DIAGNOSIS — Z01419 Encounter for gynecological examination (general) (routine) without abnormal findings: Secondary | ICD-10-CM | POA: Insufficient documentation

## 2022-11-03 NOTE — Progress Notes (Signed)
GYNECOLOGY ANNUAL PREVENTATIVE CARE ENCOUNTER NOTE  History:     Bethany Li is a 32 y.o. G1P0 female here for a routine annual gynecologic exam.  Current complaints: interested in fertility, intermittent right lower quadrant pain.   Denies abnormal vaginal bleeding, discharge, pelvic pain, problems with intercourse or other gynecologic concerns.   Pt is interested in becoming pregnant.  She is not currently on any birth control.  Her partner is 48.  She has a very remote history of chlamydia as a teen and has been pregnant since then.  She recently began having monthly periods again.  She has ankylosing spondylitis and sees a rheumatologist.  She is concerned some of her medications may be interfering with fertility.  She does take methotrexate which would be disastrous if she got pregnant.   Gynecologic History Patient's last menstrual period was 10/31/2022. Contraception: none Last Pap: unsure.   Obstetric History OB History  Gravida Para Term Preterm AB Living  1         1  SAB IAB Ectopic Multiple Live Births               # Outcome Date GA Lbr Len/2nd Weight Sex Delivery Anes PTL Lv  1 Gravida      CS-LTranv       Past Medical History:  Diagnosis Date   Anemia    Ankylosing spondylitis ([facility])    Arthritis     Past Surgical History:  Procedure Laterality Date   CESAREAN SECTION     GLAUCOMA SURGERY      Current Outpatient Medications on File Prior to Visit  Medication Sig Dispense Refill   ALPHAGAN P 0.1 % SOLN Apply 1 drop to eye 3 (three) times daily.     folic acid (FOLVITE) 1 MG tablet Take 2 tablets (2 mg total) by mouth daily. 180 tablet 3   Ixekizumab (TALTZ) 80 MG/ML SOAJ Inject 80 mg into the skin every 28 (twenty-eight) days. 3 mL 0   ketorolac (ACULAR) 0.5 % ophthalmic solution 1 drop 3 (three) times daily.     Methotrexate, PF, (RASUVO) 20 MG/0.4ML SOAJ Inject 20 mg into the skin once a week. Store at room temperature. 4.8 mL 0   prednisoLONE  acetate (PRED FORTE) 1 % ophthalmic suspension 1 drop 3 (three) times daily.     predniSONE (DELTASONE) 5 MG tablet TAKE 1 TABLET BY MOUTH EVERY DAY WITH BREAKFAST 90 tablet 0   norethindrone-ethinyl estradiol-FE (LOESTRIN FE) 1-20 MG-MCG tablet  (Patient not taking: Reported on 03/23/2022)     predniSONE (DELTASONE) 5 MG tablet Take 4 tablets by mouth daily x4 days, 3 tablets daily x4 days, 2 tablets daily x4 days, 1 tablet daily x4 days. 40 tablet 0   No current facility-administered medications on file prior to visit.    No Known Allergies  Social History:  reports that she has never smoked. She has never used smokeless tobacco. She reports current alcohol use. She reports that she does not currently use drugs.  Family History  Problem Relation Age of Onset   Rheum arthritis Father     The following portions of the patient's history were reviewed and updated as appropriate: allergies, current medications, past family history, past medical history, past social history, past surgical history and problem list.  Review of Systems Pertinent items noted in HPI and remainder of comprehensive ROS otherwise negative.  Physical Exam:  BP 127/83   Pulse 81   Ht 5\' 6"  (1.676 m)  Wt 127 lb (57.6 kg)   LMP 10/31/2022   BMI 20.50 kg/m  CONSTITUTIONAL: Well-developed, well-nourished female in no acute distress.  HENT:  Normocephalic, atraumatic, External right and left ear normal. Oropharynx is clear and moist EYES: Conjunctivae and EOM are normal.   NECK: Normal range of motion, supple, no masses.  Normal thyroid.  SKIN: Skin is warm and dry. No rash noted. Not diaphoretic. No erythema. No pallor.  Small incisions consistent with diagnostic laparoscopy noted on abdomen. MUSCULOSKELETAL: Normal range of motion. No tenderness.  No cyanosis, clubbing, or edema.  2+ distal pulses. NEUROLOGIC: Alert and oriented to person, place, and time. Normal reflexes, muscle tone coordination.  PSYCHIATRIC:  Normal mood and affect. Normal behavior. Normal judgment and thought content. CARDIOVASCULAR: Normal heart rate noted, regular rhythm RESPIRATORY: Clear to auscultation bilaterally. Effort and breath sounds normal, no problems with respiration noted. BREASTS: Symmetric in size. No masses, tenderness, skin changes, nipple drainage, or lymphadenopathy bilaterally. Performed in the presence of a chaperone. ABDOMEN: Soft, no distention noted.  No tenderness, rebound or guarding.  PELVIC: Normal appearing external genitalia and urethral meatus; normal appearing vaginal mucosa and cervix.  No abnormal discharge noted.  Pap smear obtained.  Normal uterine size, no other palpable masses, no uterine or adnexal tenderness.  Performed in the presence of a chaperone.  Vaginal swab taken   Assessment and Plan:    1. Routine screening for STI (sexually transmitted infection) Per pt request - Cervicovaginal ancillary only( [place]) - HIV Antibody (routine testing w rflx) - Hepatitis B surface antigen - RPR - Hepatitis C antibody  2. Hx of ovarian cyst Review of chart showed no adnexal mass in 2021.  If pain persists would consider pelvic ultrasound.  3. Women's annual routine gynecological examination Normal annual exam. Pt is interested in getting pregnant.  Pt will discuss with her rheumatologist if her meds need to be modified while she is attempting pregnancy.   If pt is cleared for pregnancy , suggested timed intercourse.  Briefly discussed hysterosalpingogram and semen analysis if needed for workup for possible infertility  Pt will contact us if she has been cleared to try for pregnancy - Cytology - PAP( [place]) - Hepatitis C antibody  Will follow up results of pap smear and manage accordingly. Routine preventative health maintenance measures emphasized. Please refer to After Visit Summary for other counseling recommendations.     F/u in 1 year or prn  Lynnda Shields, MD,  [place], [facility] for [place]

## 2022-11-03 NOTE — Progress Notes (Signed)
Pt states she has had hx of right ovarian cyst. Pt states was diagnosed about 6 years ago.  Pt states she still has sharp pains on her right side.  Pt does not have any form of BC.  Has an autoimmune disorder and chooses not to take [facility].

## 2022-11-04 LAB — HEPATITIS C ANTIBODY: Hep C Virus Ab: NONREACTIVE

## 2022-11-04 LAB — HEPATITIS B SURFACE ANTIGEN: Hepatitis B Surface Ag: NEGATIVE

## 2022-11-04 LAB — RPR: RPR Ser Ql: NONREACTIVE

## 2022-11-04 LAB — HIV ANTIBODY (ROUTINE TESTING W REFLEX): HIV Screen 4th Generation wRfx: NONREACTIVE

## 2022-11-07 LAB — CERVICOVAGINAL ANCILLARY ONLY
Bacterial Vaginitis (gardnerella): POSITIVE — AB
Candida Glabrata: NEGATIVE
Candida Vaginitis: NEGATIVE
Chlamydia: NEGATIVE
Comment: NEGATIVE
Comment: NEGATIVE
Comment: NEGATIVE
Comment: NEGATIVE
Comment: NEGATIVE
Comment: NORMAL
Neisseria Gonorrhea: NEGATIVE
Trichomonas: NEGATIVE

## 2022-11-08 ENCOUNTER — Other Ambulatory Visit: Payer: Self-pay | Admitting: *Deleted

## 2022-11-08 DIAGNOSIS — B9689 Other specified bacterial agents as the cause of diseases classified elsewhere: Secondary | ICD-10-CM

## 2022-11-08 MED ORDER — METRONIDAZOLE 500 MG PO TABS: 500.0000 mg | ORAL_TABLET | Freq: Two times a day (BID) | ORAL | 0 refills | Status: DC

## 2022-11-08 NOTE — Progress Notes (Signed)
RX Flagyl for BV per protocol.

## 2022-11-08 NOTE — Progress Notes (Signed)
TC no answer, HIPAA compliant VM left with call back number. No MyChart access. RX Flagyl per protocol.

## 2022-11-09 ENCOUNTER — Encounter: Payer: Self-pay | Admitting: Physician Assistant

## 2022-11-09 ENCOUNTER — Telehealth: Payer: Self-pay | Admitting: Pharmacist

## 2022-11-09 ENCOUNTER — Ambulatory Visit: Payer: Medicaid Other | Attending: Physician Assistant | Admitting: Physician Assistant

## 2022-11-09 VITALS — BP 115/83 | HR 80 | Ht 66.0 in | Wt 126.6 lb

## 2022-11-09 DIAGNOSIS — M533 Sacrococcygeal disorders, not elsewhere classified: Secondary | ICD-10-CM

## 2022-11-09 DIAGNOSIS — L401 Generalized pustular psoriasis: Secondary | ICD-10-CM

## 2022-11-09 DIAGNOSIS — M450 Ankylosing spondylitis of multiple sites in spine: Secondary | ICD-10-CM

## 2022-11-09 DIAGNOSIS — Z79899 Other long term (current) drug therapy: Secondary | ICD-10-CM | POA: Diagnosis not present

## 2022-11-09 DIAGNOSIS — Z7952 Long term (current) use of systemic steroids: Secondary | ICD-10-CM

## 2022-11-09 DIAGNOSIS — H44113 Panuveitis, bilateral: Secondary | ICD-10-CM | POA: Diagnosis not present

## 2022-11-09 DIAGNOSIS — R519 Headache, unspecified: Secondary | ICD-10-CM

## 2022-11-09 DIAGNOSIS — G8929 Other chronic pain: Secondary | ICD-10-CM

## 2022-11-09 DIAGNOSIS — M722 Plantar fascial fibromatosis: Secondary | ICD-10-CM

## 2022-11-09 DIAGNOSIS — R5383 Other fatigue: Secondary | ICD-10-CM

## 2022-11-09 DIAGNOSIS — Z111 Encounter for screening for respiratory tuberculosis: Secondary | ICD-10-CM

## 2022-11-09 DIAGNOSIS — M7918 Myalgia, other site: Secondary | ICD-10-CM

## 2022-11-09 LAB — CYTOLOGY - PAP
Comment: NEGATIVE
Diagnosis: NEGATIVE
High risk HPV: NEGATIVE

## 2022-11-09 MED ORDER — PREDNISONE 5 MG PO TABS: ORAL_TABLET | ORAL | 0 refills | Status: DC

## 2022-11-09 NOTE — Telephone Encounter (Signed)
Please start Cimzia IN-OFFICE BIV.  Dose will be Cimzia 400 mg at weeks 0,2,4 then Cimzia 400 mg every 28 days  Patient can start Cimzia on or after 11/21/2022.   Knox Saliva, PharmD, MPH, BCPS, CPP Clinical Pharmacist (Rheumatology and Pulmonology)

## 2022-11-09 NOTE — Progress Notes (Signed)
CBC WNL

## 2022-11-09 NOTE — Progress Notes (Signed)
Pharmacy Note  Subjective: Patient presents today to [facility] for follow up office visit.   Patient seen by the pharmacist for counseling on Cimzia for ankylosing spondylitis. Last dose of Taltz on 10/23/2022.  Diagnosis of heart failure: No  Objective:  CBC    Component Value Date/Time   WBC 4.4 03/23/2022 1330   RBC 4.11 03/23/2022 1330   HGB 12.2 03/23/2022 1330   HCT 36.3 03/23/2022 1330   PLT 231 03/23/2022 1330   MCV 88.3 03/23/2022 1330   MCH 29.7 03/23/2022 1330   MCHC 33.6 03/23/2022 1330   RDW 12.6 03/23/2022 1330   LYMPHSABS 1,558 03/23/2022 1330   EOSABS 150 03/23/2022 1330   BASOSABS 31 03/23/2022 1330    CMP     Component Value Date/Time   NA 139 03/23/2022 1330   K 3.7 03/23/2022 1330   CL 107 03/23/2022 1330   CO2 24 03/23/2022 1330   GLUCOSE 75 03/23/2022 1330   BUN 11 03/23/2022 1330   CREATININE 0.83 03/23/2022 1330   CALCIUM 9.4 03/23/2022 1330   PROT 7.0 03/23/2022 1330   ALBUMIN 4.1 07/23/2020 0751   AST 18 03/23/2022 1330   ALT 17 03/23/2022 1330   ALKPHOS 47 07/23/2020 0751   BILITOT 0.9 03/23/2022 1330   GFRNONAA >60 07/23/2020 0751     Baseline Immunosuppressant Therapy Labs TB GOLD    Latest Ref Rng & Units 04/30/2021    9:45 AM  Quantiferon TB Gold  Quantiferon TB Gold Plus NEGATIVE NEGATIVE    Hepatitis Panel    Latest Ref Rng & Units 11/03/2022    5:00 PM  Hepatitis  Hep B Surface Ag Negative Negative    HIV Lab Results  Component Value Date   HIV Non Reactive 11/03/2022   HIV NON-REACTIVE 04/30/2021   Immunoglobulins    Latest Ref Rng & Units 04/30/2021    9:45 AM  Immunoglobulin Electrophoresis  IgA  47 - 310 mg/dL 390   IgG 600 - 1,640 mg/dL 1,244   IgM 50 - 300 mg/dL 197    SPEP    Latest Ref Rng & Units 03/23/2022    1:30 PM  Serum Protein Electrophoresis  Total Protein 6.1 - 8.1 g/dL 7.0    G6PD No results found for: "G6PDH" TPMT No results found for: "TPMT"   Does patient have  diagnosis of heart failure?  No  Assessment/Plan:  Counseled patient that Cimzia is a TNF blocking agent.  Counseled patient on purpose, proper use, and adverse effects of Cimzia.  Reviewed the most common adverse effects including infections, headache, and injection site reactions. Discussed that there is the possibility of an increased risk of malignancy including non-melanoma skin cancer but it is not well understood if this increased risk is due to the medication or the disease state.  Advised patient to get yearly dermatology exams due to risk of skin cancer.  Counseled patient that Cimzia should be held prior to scheduled surgery.  Counseled patient to avoid live vaccines while on Cimzia.  Recommend annual influenza, PCV 15 or PCV20 or Pneumovax 23, and Shingrix as indicated.   Reviewed the importance of regular labs while on Cimzia therapy. Will monitor CBC and CMP 1 month after starting and then every 3 months routinely thereafter. Will monitor TB gold annually. Standing orders placed. Provided patient with medication education material and answered all questions.  Patient voiced understanding.  Patient consented to Cimzia.  Will upload consent into the media tab.  Reviewed storage  instructions for Cimzia.  Will apply for Cimzia through patient's insurance.  Advised initial injection must be administered in office.    Dermatology referral was placed today.  Dose will be Cimzia 400 mg at weeks 0,2,4 then Cimzia 400 mg every 28 days  Prescription pending labs and insurance approval. Patient can start Cimzia on or after 11/21/2022.  Bethany Li, PharmD PGY-1 [facility] Resident

## 2022-11-09 NOTE — Patient Instructions (Addendum)
Standing Labs We placed an order today for your standing lab work.   Please have your standing labs drawn in 1 month and then every 3 months   Please have your labs drawn 2 weeks prior to your appointment so that the provider can discuss your lab results at your appointment.  Please note that you may see your imaging and lab results in [facility] before we have reviewed them. We will contact you once all results are reviewed. Please allow our office up to 72 hours to thoroughly review all of the results before contacting the office for clarification of your results.  Lab hours are:   Monday through Thursday from 8:00 am -12:30 pm and 1:00 pm-5:00 pm and Friday from 8:00 am-12:00 pm.  Please be advised, all patients with office appointments requiring lab work will take precedent over walk-in lab work.   Labs are drawn by [facility]. Please bring your co-pay at the time of your lab draw.  You may receive a bill from [facility] for your lab work.  Please note if you are on Hydroxychloroquine and and an order has been placed for a Hydroxychloroquine level, you will need to have it drawn 4 hours or more after your last dose.  If you wish to have your labs drawn at another location, please call the office 24 hours in advance so we can fax the orders.  The office is located at [address], [place], [place], [place] [address] No appointment is necessary.    If you have any questions regarding directions or hours of operation,  please call [phone].   As a reminder, please drink plenty of water prior to coming for your lab work. Thanks!   Certolizumab Pegol Injection What is this medication? CERTOLIZUMAB (SER toe LIZ oo mab) treats autoimmune conditions, such as psoriasis, arthritis, and Crohn's disease. It works by slowing down an overactive immune system. It belongs to a group of medications called TNF inhibitors. It is a monoclonal antibody. This medicine may be used for other purposes; ask  your health care provider or pharmacist if you have questions. COMMON BRAND NAME(S): Cimzia What should I tell my care team before I take this medication? They need to know if you have any of these conditions: Cancer Diabetes Guillain-Barre syndrome Heart failure Hepatitis B or history of hepatitis B infection Immune system problems Infection or history of infections Low blood counts, such as low white cell, platelet, or red cell counts Multiple sclerosis Recently received or scheduled to receive a vaccine Tuberculosis, a positive skin test for tuberculosis, or recent close contact with someone who has tuberculosis An unusual or allergic reaction to certolizumab, other medications, latex, rubber, foods, dyes, or preservatives Pregnant or trying to get pregnant Breast-feeding How should I use this medication? This medication is injected under the skin. It is usually given by your care team in a hospital or clinic setting. It may also be given at home. If you get this medication at home, you will be taught how to prepare and give it. Use exactly as directed. Take it as directed on the prescription label at the same time every day. Keep taking it unless your care team tells you to stop. It is important that you put your used needles and syringes in a special sharps container. Do not put them in a trash can. If you do not have a sharps container, call your pharmacist or care team to get one. A special MedGuide will be given to you  by the pharmacist with each prescription and refill. Be sure to read this information carefully each time. Talk to your care team about the use of this medication in children. Special care may be needed. Overdosage: If you think you have taken too much of this medicine contact a poison control center or emergency room at once. NOTE: This medicine is only for you. Do not share this medicine with others. What if I miss a dose? If you get this medication at a hospital  or clinic: It is important not to miss your dose. Call your care team if you are unable to keep an appointment. If you give yourself this medication at home: If you miss a dose, take it as soon as you can. If it is almost time for your next dose, take only that dose. Do not take double or extra doses. Call your care team with questions. What may interact with this medication? Do not take this medication with any of the following: Biologic medications, such as abatacept, adalimumab, anakinra, etanercept, golimumab, infliximab, natalizumab, rituximab, secukinumab, tocilizumab, ustekinumab Live vaccines Tofacitinib This list may not describe all possible interactions. Give your health care provider a list of all the medicines, herbs, non-prescription drugs, or dietary supplements you use. Also tell them if you smoke, drink alcohol, or use illegal drugs. Some items may interact with your medicine. What should I watch for while using this medication? Visit your care team for regular checks on your progress. Tell your care team if your symptoms do not start to get better or if they get worse. Your condition will be monitored carefully while you are receiving this medication. You will be tested for tuberculosis (TB) before you start this medication. If your care team prescribes any medication for TB, you should start taking the TB medication before starting this medication. Make sure to finish the full course of TB medication. This medication may increase your risk of getting an infection. Call your care team for advice if you get a fever, chills, sore throat, or other symptoms of a cold or flu. Do not treat yourself. Try to avoid being around people who are sick. Talk to your care team about your risk of cancer. You may be more at risk for certain types of cancers if you take this medication. What side effects may I notice from receiving this medication? Side effects that you should report to your care team  as soon as possible: Allergic reactions--skin rash, itching, hives, swelling of the face, lips, tongue, or throat Aplastic anemia--unusual weakness or fatigue, dizziness, headache, trouble breathing, increased bleeding or bruising Body pain, tingling, or numbness Heart failure--shortness of breath, swelling of the ankles, feet, or hands, sudden weight gain, unusual weakness or fatigue Infection--fever, chills, cough, sore throat, wounds that don't heal, pain or trouble when passing urine, general feeling of discomfort or being unwell Lupus-like syndrome--joint pain, swelling, or stiffness, butterfly-shaped rash on the face, rashes that get worse in the sun, fever, unusual weakness or fatigue Seizures Unusual bruising or bleeding Side effects that usually do not require medical attention (report to your care team if they continue or are bothersome): Back pain Cough Fatigue Fever Headache Pain, redness, or irritation at injection site Sore throat This list may not describe all possible side effects. Call your doctor for medical advice about side effects. You may report side effects to FDA at 1-800-FDA-1088. Where should I keep my medication? Keep out of the reach of children and pets. Store in the  refrigerator. Do not freeze. Keep this medication in the original packaging until you are ready to take it. Protect from light. Get rid of any unused medication after the expiration date. To get rid of medications that are no longer needed or have expired: Take the medication to a medication take-back program. Check with your pharmacy or law enforcement to find a location. If you cannot return the medication, ask your pharmacist or care team how to get rid of this medication safely.   If you have signs or symptoms of an infection or start antibiotics: First, call your PCP for workup of your infection. Hold your medication through the infection, until you complete your antibiotics, and until  symptoms resolve if you take the following: Injectable medication (Actemra, Benlysta, Cimzia, Cosentyx, Enbrel, Humira, Kevzara, Orencia, Remicade, Simponi, Stelara, Taltz, Tremfya) Methotrexate Leflunomide (Arava) Mycophenolate (Cellcept) Bethany Li, Olumiant, or Rinvoq   Vaccines You are taking a medication(s) that can suppress your immune system.  The following immunizations are recommended: Flu annually Covid-19  Td/Tdap (tetanus, diphtheria, pertussis) every 10 years Pneumonia (Prevnar 15 then Pneumovax 23 at least 1 year apart.  Alternatively, can take Prevnar 20 without needing additional dose) Shingrix: 2 doses from 4 weeks to 6 months apart  Please check with your PCP to make sure you are up to date.

## 2022-11-10 NOTE — Progress Notes (Signed)
CMP WNL

## 2022-11-10 NOTE — Telephone Encounter (Signed)
Case created in [employer] portal for in-office Cimzia injections. Spoke with patient - she consented to text message for her digital authorization.   Case # [id number]   Knox Saliva, PharmD, MPH, BCPS, CPP Clinical Pharmacist (Rheumatology and Pulmonology)

## 2022-11-11 LAB — CBC WITH DIFFERENTIAL/PLATELET
Absolute Monocytes: 549 cells/uL (ref 200–950)
Basophils Absolute: 18 cells/uL (ref 0–200)
Basophils Relative: 0.3 %
Eosinophils Absolute: 118 cells/uL (ref 15–500)
Eosinophils Relative: 2 %
HCT: 35.8 % (ref 35.0–45.0)
Hemoglobin: 12.3 g/dL (ref 11.7–15.5)
Lymphs Abs: 1322 cells/uL (ref 850–3900)
MCH: 29.6 pg (ref 27.0–33.0)
MCHC: 34.4 g/dL (ref 32.0–36.0)
MCV: 86.3 fL (ref 80.0–100.0)
MPV: 10.6 fL (ref 7.5–12.5)
Monocytes Relative: 9.3 %
Neutro Abs: 3894 cells/uL (ref 1500–7800)
Neutrophils Relative %: 66 %
Platelets: 216 10*3/uL (ref 140–400)
RBC: 4.15 10*6/uL (ref 3.80–5.10)
RDW: 13 % (ref 11.0–15.0)
Total Lymphocyte: 22.4 %
WBC: 5.9 10*3/uL (ref 3.8–10.8)

## 2022-11-11 LAB — COMPLETE METABOLIC PANEL WITH GFR
AG Ratio: 1.7 (calc) (ref 1.0–2.5)
ALT: 24 U/L (ref 6–29)
AST: 24 U/L (ref 10–30)
Albumin: 4.5 g/dL (ref 3.6–5.1)
Alkaline phosphatase (APISO): 43 U/L (ref 31–125)
BUN: 13 mg/dL (ref 7–25)
CO2: 24 mmol/L (ref 20–32)
Calcium: 9.3 mg/dL (ref 8.6–10.2)
Chloride: 107 mmol/L (ref 98–110)
Creat: 0.73 mg/dL (ref 0.50–0.97)
Globulin: 2.7 g/dL (calc) (ref 1.9–3.7)
Glucose, Bld: 81 mg/dL (ref 65–99)
Potassium: 4 mmol/L (ref 3.5–5.3)
Sodium: 139 mmol/L (ref 135–146)
Total Bilirubin: 0.6 mg/dL (ref 0.2–1.2)
Total Protein: 7.2 g/dL (ref 6.1–8.1)
eGFR: 113 mL/min/{1.73_m2} (ref >=60)

## 2022-11-11 LAB — QUANTIFERON-TB GOLD PLUS
Mitogen-NIL: >10 IU/mL
NIL: 0.03 IU/mL
QuantiFERON-TB Gold Plus: NEGATIVE
TB1-NIL: 0 IU/mL
TB2-NIL: 0 IU/mL

## 2022-11-11 NOTE — Progress Notes (Signed)
TB gold negative

## 2022-11-17 NOTE — Telephone Encounter (Signed)
Received fax from [facility] stating that provider is out of network with patient's plan however we are in-network with patient's plan. Left VM with case manager, Salvatore Marvel [phone], option 2, option 1, extension [phone]) to discuss  Bethany Li, PharmD, MPH, BCPS, CPP Clinical Pharmacist (Rheumatology and Pulmonology)

## 2022-11-17 NOTE — Telephone Encounter (Signed)
Madeline returned call. She states she will provide new clinic NPI to [facility] team and send request to rerun benefits for Cimzia in-office benefits.  Knox Saliva, PharmD, MPH, BCPS, CPP Clinical Pharmacist (Rheumatology and Pulmonology)

## 2022-11-25 NOTE — Telephone Encounter (Signed)
I spoke with [employer] Portal case manager as Cimzia in-office injection case was closed out again. Apparently we are still showing as out-of-network for patient still  Called [employer] to initiate over the phone. Per rep, [id number] is handled by pharmacy department.  Started authorization for [phone] over the phone today. Clinicals sent via fax. Determination should be within 48 business hours.  PA #: [id number] Phone: [phone] Fax: [phone]  Knox Saliva, PharmD, MPH, BCPS, CPP Clinical Pharmacist (Rheumatology and Pulmonology)

## 2022-11-28 NOTE — Telephone Encounter (Addendum)
Received notification from  [facility]  regarding a prior authorization for [facility]. Authorization has been APPROVED from 11/25/22 to 12/26/22 for 3 Cimzia starter kits PFS (3 per 28 days for loading dose). Maintenance dose has been approved from 12/27/22 to 11/25/23 for 1 kit per 28 days. Approval letter sent to scan center. Approval letter does not indicate J-code and specifically state PFS so called insurance to clarify approval details.  Phone # [phone]  I called pharmacy help desk and they state that medical prior authorizations must go through [facility]. This authorization will not apply to in-office adnmin, I attempted to enroll our clinic but must request access. Access has been requested.   Knox Saliva, PharmD, MPH, BCPS, CPP Clinical Pharmacist (Rheumatology and Pulmonology)

## 2022-12-09 NOTE — Telephone Encounter (Signed)
[employer] enrollment is still pending. If no resolution by Tuesday, pharmacy team will need to call insurance again  Knox Saliva, PharmD, MPH, BCPS, CPP Clinical Pharmacist (Rheumatology and Pulmonology)

## 2022-12-14 NOTE — Telephone Encounter (Signed)
Patient called requesting update but I advised that authorization for medical benefit portla is pending. I've emailed contact points for [employer] portal login  Knox Saliva, PharmD, MPH, BCPS, CPP Clinical Pharmacist (Rheumatology and Pulmonology)

## 2022-12-16 NOTE — Telephone Encounter (Signed)
Called [facility] department [employer] to initiate Cimzia in-office auth over phone (as [employer] portal access is still pending through IT dept). Per rep, for buy-and -bill meds can complete PA form that is online and indicate that it will be buy and bill when submitting. Form completed and faxed with clinicals  Phone: [phone] Fax: [phone]  Knox Saliva, PharmD, MPH, BCPS, CPP Clinical Pharmacist (Rheumatology and Pulmonology)

## 2022-12-20 NOTE — Telephone Encounter (Signed)
Attempted to contact the patient and left message for patient to call the office.  

## 2022-12-20 NOTE — Telephone Encounter (Signed)
Spoke with patient and schedule patient for her loading doses of Cimzia.

## 2022-12-20 NOTE — Telephone Encounter (Signed)
Received fax from [employer] stating PA is not required for the Physician's Drug Program for Cimzia [phone]). Patient can be scheduled for Cimzia in-office injections  ID # [id number]  Per [employer] portal, her plan is active through 04/26/2023  Knox Saliva, PharmD, MPH, BCPS, CPP Clinical Pharmacist (Rheumatology and Pulmonology)

## 2022-12-21 ENCOUNTER — Ambulatory Visit: Payer: Medicaid Other | Attending: Rheumatology | Admitting: *Deleted

## 2022-12-21 VITALS — BP 102/68 | HR 98

## 2022-12-21 DIAGNOSIS — Z79899 Other long term (current) drug therapy: Secondary | ICD-10-CM

## 2022-12-21 DIAGNOSIS — M450 Ankylosing spondylitis of multiple sites in spine: Secondary | ICD-10-CM

## 2022-12-21 MED ORDER — CERTOLIZUMAB PEGOL 2 X 200 MG ~~LOC~~ KIT
400.0000 mg | PACK | Freq: Once | SUBCUTANEOUS | Status: AC
  Administered 2022-12-21: 400 mg via SUBCUTANEOUS

## 2022-12-21 NOTE — Progress Notes (Signed)
Subjective:   Patient presents to clinic today to receive initial loading dose of Cimzia.  Patient running a fever or have signs/symptoms of infection? No  Patient currently on antibiotics for the treatment of infection? No  Patient have any upcoming invasive procedures/surgeries? No  Objective: CMP     Component Value Date/Time   NA 139 11/09/2022 0921   K 4.0 11/09/2022 0921   CL 107 11/09/2022 0921   CO2 24 11/09/2022 0921   GLUCOSE 81 11/09/2022 0921   BUN 13 11/09/2022 0921   CREATININE 0.73 11/09/2022 0921   CALCIUM 9.3 11/09/2022 0921   PROT 7.2 11/09/2022 0921   ALBUMIN 4.1 07/23/2020 0751   AST 24 11/09/2022 0921   ALT 24 11/09/2022 0921   ALKPHOS 47 07/23/2020 0751   BILITOT 0.6 11/09/2022 0921   GFRNONAA >60 07/23/2020 0751    CBC    Component Value Date/Time   WBC 5.9 11/09/2022 0921   RBC 4.15 11/09/2022 0921   HGB 12.3 11/09/2022 0921   HCT 35.8 11/09/2022 0921   PLT 216 11/09/2022 0921   MCV 86.3 11/09/2022 0921   MCH 29.6 11/09/2022 0921   MCHC 34.4 11/09/2022 0921   RDW 13.0 11/09/2022 0921   LYMPHSABS 1,322 11/09/2022 0921   EOSABS 118 11/09/2022 0921   BASOSABS 18 11/09/2022 0921    Baseline Immunosuppressant Therapy Labs TB GOLD    Latest Ref Rng & Units 11/09/2022    9:21 AM  Quantiferon TB Gold  Quantiferon TB Gold Plus NEGATIVE NEGATIVE    Hepatitis Panel    Latest Ref Rng & Units 11/03/2022    5:00 PM  Hepatitis  Hep B Surface Ag Negative Negative    HIV Lab Results  Component Value Date   HIV Non Reactive 11/03/2022   HIV NON-REACTIVE 04/30/2021   Immunoglobulins    Latest Ref Rng & Units 04/30/2021    9:45 AM  Immunoglobulin Electrophoresis  IgA  47 - 310 mg/dL 390   IgG 600 - 1,640 mg/dL 1,244   IgM 50 - 300 mg/dL 197    SPEP    Latest Ref Rng & Units 11/09/2022    9:21 AM  Serum Protein Electrophoresis  Total Protein 6.1 - 8.1 g/dL 7.2    G6PD No results found for: "G6PDH" TPMT No results found for: "TPMT"    Chest x-ray: 07/23/2020 Lungs clear. Cardiac silhouette normal.   Assessment/Plan:   Administrations This Visit     certolizumab pegol (CIMZIA) kit 400 mg     Admin Date 12/21/2022 Action Given Dose 400 mg Route Subcutaneous Administered By Carole Binning, LPN             Patient tolerated injection well.   Appointment for next injection scheduled for 01/04/2023.  Patient due for labs today and they were drawn while in office.  Patient is to call and reschedule appointment if running a fever with signs/symptoms of infection, on antibiotics for active infection or has an upcoming invasive procedure.  All questions encouraged and answered.  Instructed patient to call with any further questions or concerns.

## 2022-12-22 LAB — CBC WITH DIFFERENTIAL/PLATELET
Absolute Monocytes: 577 cells/uL (ref 200–950)
Basophils Absolute: 52 cells/uL (ref 0–200)
Basophils Relative: 0.7 %
Eosinophils Absolute: 111 cells/uL (ref 15–500)
Eosinophils Relative: 1.5 %
HCT: 38.6 % (ref 35.0–45.0)
Hemoglobin: 13.1 g/dL (ref 11.7–15.5)
Lymphs Abs: 1524 cells/uL (ref 850–3900)
MCH: 29.6 pg (ref 27.0–33.0)
MCHC: 33.9 g/dL (ref 32.0–36.0)
MCV: 87.1 fL (ref 80.0–100.0)
MPV: 11.4 fL (ref 7.5–12.5)
Monocytes Relative: 7.8 %
Neutro Abs: 5136 cells/uL (ref 1500–7800)
Neutrophils Relative %: 69.4 %
Platelets: 217 10*3/uL (ref 140–400)
RBC: 4.43 10*6/uL (ref 3.80–5.10)
RDW: 12.3 % (ref 11.0–15.0)
Total Lymphocyte: 20.6 %
WBC: 7.4 10*3/uL (ref 3.8–10.8)

## 2022-12-22 LAB — COMPLETE METABOLIC PANEL WITH GFR
AG Ratio: 1.5 (calc) (ref 1.0–2.5)
ALT: 13 U/L (ref 6–29)
AST: 17 U/L (ref 10–30)
Albumin: 4.5 g/dL (ref 3.6–5.1)
Alkaline phosphatase (APISO): 44 U/L (ref 31–125)
BUN: 11 mg/dL (ref 7–25)
CO2: 23 mmol/L (ref 20–32)
Calcium: 9.5 mg/dL (ref 8.6–10.2)
Chloride: 105 mmol/L (ref 98–110)
Creat: 0.77 mg/dL (ref 0.50–0.97)
Globulin: 3.1 g/dL (calc) (ref 1.9–3.7)
Glucose, Bld: 81 mg/dL (ref 65–99)
Potassium: 4.1 mmol/L (ref 3.5–5.3)
Sodium: 138 mmol/L (ref 135–146)
Total Bilirubin: 0.6 mg/dL (ref 0.2–1.2)
Total Protein: 7.6 g/dL (ref 6.1–8.1)
eGFR: 106 mL/min/{1.73_m2} (ref >=60)

## 2022-12-22 NOTE — Progress Notes (Signed)
CBC and CMP are normal.

## 2022-12-26 ENCOUNTER — Telehealth: Payer: Self-pay

## 2022-12-26 NOTE — Telephone Encounter (Signed)
Patient called and left message on triage vm.  Attempted to call patient. No answer. Left vm for patient to return call to office.

## 2022-12-28 NOTE — Progress Notes (Signed)
Office Visit Note  Patient: Bethany Li             Date of Birth: 1991/01/29           MRN: [id number]             PCP: Jackie Plum, MD Referring: Jackie Plum, MD Visit Date: 01/11/2023 Occupation: @GUAROCC @  Subjective:  Medication monitoring   History of Present Illness: Randeep Colglazier is a 32 y.o. female with history of ankylosing spondylitis and panuveitis.  Patient was started on in-office Cimzia on 12/21/2022.  She has been tolerating Cimzia without any side effects.  She had her second dose on 01/04/2023 and will be returning for her third dose on 01/18/2023.  She denies any injection site reactions or side effects.  She denies any recent or recurrent infections.  She has discontinued both Rasuvo and Taltz as advised.  She remains on prednisone 5 mg daily.  The patient reports that she has OB/GYN appointment on 01/18/2023 at which time she is planning to resume birth control. Patient has not followed up with her ophthalmologist to notify them that she is planning pregnancy.  She has not had any signs or symptoms of a uveitis flare. She denies any signs or symptoms of an ankylosing spondylitis flare.  She has some soreness in her back at times. She denies any Achilles tendinitis or plantar fasciitis.  She denies any joint swelling.    Activities of Daily Living:  Patient reports morning stiffness for 1 hour.   Patient Reports nocturnal pain.  Difficulty dressing/grooming: Denies Difficulty climbing stairs: Reports Difficulty getting out of chair: Denies Difficulty using hands for taps, buttons, cutlery, and/or writing: Reports  Review of Systems  Constitutional:  Positive for fatigue.  HENT:  Negative for mouth sores, mouth dryness and nose dryness.   Eyes:  Negative for pain, visual disturbance and dryness.  Respiratory:  Negative for cough, hemoptysis, shortness of breath and difficulty breathing.   Cardiovascular:  Negative for chest pain,  palpitations, hypertension and swelling in legs/feet.  Gastrointestinal:  Negative for blood in stool, constipation and diarrhea.  Genitourinary:  Negative for painful urination and involuntary urination.  Musculoskeletal:  Positive for joint pain, joint pain, joint swelling, myalgias, morning stiffness, muscle tenderness and myalgias. Negative for gait problem and muscle weakness.  Skin:  Negative for color change, pallor, rash, hair loss, nodules/bumps, skin tightness, ulcers and sensitivity to sunlight.  Allergic/Immunologic: Positive for susceptible to infections.  Neurological:  Negative for dizziness, numbness, headaches and weakness.  Hematological:  Negative for swollen glands.  Psychiatric/Behavioral:  Negative for depressed mood and sleep disturbance. The patient is not nervous/anxious.     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   Objective: Vital Signs: BP 106/72 (BP Location: Left Arm, Patient Position: Sitting, Cuff Size: Normal)   Pulse 91   Resp 15   Ht 5\' 6"  (1.676 m)   Wt 124 lb 12.8 oz (56.6 kg)   BMI 20.14 kg/m    Physical Exam Vitals and nursing note reviewed.  Constitutional:      Appearance: She is well-developed.  HENT:     Head: Normocephalic and atraumatic.  Eyes:     Conjunctiva/sclera: Conjunctivae  normal.  Cardiovascular:     Rate and Rhythm: Normal rate and regular rhythm.     Heart sounds: Normal heart sounds.  Pulmonary:     Effort: Pulmonary effort is normal.     Breath sounds: Normal breath sounds.  Abdominal:     General: Bowel sounds are normal.     Palpations: Abdomen is soft.  Musculoskeletal:     Cervical back: Normal range of motion.  Lymphadenopathy:     Cervical: No cervical adenopathy.  Skin:    General: Skin is warm and dry.     Capillary Refill: Capillary refill takes less than 2 seconds.  Neurological:     Mental Status: She is alert and oriented to person, place, and time.  Psychiatric:        Behavior: Behavior normal.      Musculoskeletal Exam: C-spine, thoracic spine, lumbar spine have good range of motion.  No midline spinal tenderness.  No SI joint tenderness.  Shoulder joints, elbow joints, wrist joints, MCPs, PIPs, DIPs have good range of motion with no synovitis.  Complete fist formation bilaterally.  Hip joints have good range of motion with no groin pain.  Knee joints have good range of motion with no warmth or effusion.  Ankle joints have good range of motion with no tenderness or joint swelling.  No evidence of Achilles tendinitis or plantar fasciitis.  CDAI Exam: CDAI Score: -- Patient Global: --; Provider Global: -- Swollen: --; Tender: -- Joint Exam 01/11/2023   No joint exam has been documented for this visit   There is currently no information documented on the homunculus. Go to the Rheumatology activity and complete the homunculus joint exam.  Investigation: No additional findings.  Imaging: No results found.  Recent Labs: Lab Results  Component Value Date   WBC 7.4 12/21/2022   HGB 13.1 12/21/2022   PLT 217 12/21/2022   NA 138 12/21/2022   K 4.1 12/21/2022   CL 105 12/21/2022   CO2 23 12/21/2022   GLUCOSE 81 12/21/2022   BUN 11 12/21/2022   CREATININE 0.77 12/21/2022   BILITOT 0.6 12/21/2022   ALKPHOS 47 07/23/2020    AST 17 12/21/2022   ALT 13 12/21/2022   PROT 7.6 12/21/2022   ALBUMIN 4.1 07/23/2020   CALCIUM 9.5 12/21/2022   QFTBGOLDPLUS NEGATIVE 11/09/2022    Speciality Comments: Diagnosed in [place].  Treated with Remicade, Humira and Simponi per patient.    Appointment every 3 months  Procedures:  No procedures performed Allergies: Patient has no known allergies.    Assessment / Plan:     Visit Diagnoses: Ankylosing spondylitis of multiple sites in spine: She has not had any signs or symptoms of ankylosing spondylitis flare.  She was initiated on an office administered Cimzia on 12/21/2022.  She has been off of Taltz since the end of January 2024 and has been off of Rasuvo since the beginning of February 2024.  She is currently family-planning.  She has an upcoming  appointment with her OB/GYN on 01/18/2023 to discuss getting on birth control during the methotrexate washout period of at least 3 months prior to conception. She is tolerating Cimzia without any side effects or injection site reactions.  She has not had any recent or recurrent infections.  She has not had any signs or symptoms of a flare.  She had no SI joint tenderness or midline spinal tenderness on examination today.  No synovitis or dactylitis was noted.  No evidence of Achilles tendinitis or plantar fasciitis.  She has not had any signs or symptoms of a uveitis flare.  She plans on scheduling an appointment with her ophthalmologist to notify them that she is currently family-planning and has discontinued Rasuvo. She remains on prednisone 5 mg 1 tablet daily.  Discussed that the goal will be to taper off of prednisone or at least remain on the lowest dose of prednisone possible during the pregnancy.  The current plan will be to start reducing prednisone by 1 mg every month starting at her follow-up visit in 2 months.  High risk medication use - Patient is currently family planning. She was started on in-office administered cimzia on  12/21/22.  Discontinued taltz and Rasuvo.  TB gold negative on 11/09/22.  CBC and CMP updated on 12/21/22.  She will have updated lab work at her next Cimzia visit on 01/18/23.   Discussed the importance of holding cimzia if she develops signs or symptoms of an infection and to resume once the infection has completely cleared.   Referral to dermatology was placed today. - Plan: COMPLETE METABOLIC PANEL WITH GFR, CBC with Differential/Platelet  Panuveitis of both eyes: No recent flares.  She plans on following up with her ophthalmologist to notify them that she is currently family-planning and has been switched from [place] to [place].  She will also notify them that she has discontinued Rasuvo.  Pustular psoriasis: No recent flares.  No pustular psoriasis noted on examination today.   Long term (current) use of systemic steroids: She remains on prednisone 5 mg 1 tablet by mouth daily.  She is aware of the risk of long term prednisone use.  Plan to taper prednisone by 1 mg every month starting at her follow up visit.    Chronic SI joint pain: No SI joint tenderness upon palpation today.    Myofascial pain: No recent flares.    Other fatigue: Stable.   Plantar fasciitis, bilateral: Asymptomatic currently.   Chronic nonintractable headache, unspecified headache type    Orders: Orders Placed This Encounter  Procedures   COMPLETE METABOLIC PANEL WITH GFR   CBC with Differential/Platelet   No orders of the defined types were placed in this encounter.    Follow-Up Instructions: Return in about 2 months (around 03/13/2023) for Ankylosing Spondylitis.   Gearldine Bienenstock, PA-C  Note - This record has been created using Dragon software.  Chart creation errors have been sought, but may not always  have been located. Such creation errors do not reflect on  the standard of medical care.

## 2023-01-04 ENCOUNTER — Ambulatory Visit: Payer: Medicaid Other | Attending: Rheumatology | Admitting: *Deleted

## 2023-01-04 VITALS — BP 100/64 | HR 75

## 2023-01-04 DIAGNOSIS — M450 Ankylosing spondylitis of multiple sites in spine: Secondary | ICD-10-CM

## 2023-01-04 MED ORDER — CERTOLIZUMAB PEGOL 2 X 200 MG ~~LOC~~ KIT
400.0000 mg | PACK | Freq: Once | SUBCUTANEOUS | Status: AC
  Administered 2023-01-04: 400 mg via SUBCUTANEOUS

## 2023-01-04 NOTE — Progress Notes (Signed)
Subjective:   Patient presents to clinic today to receive monthly dose of Cimzia.  Patient running a fever or have signs/symptoms of infection? No  Patient currently on antibiotics for the treatment of infection? No  Patient have any upcoming invasive procedures/surgeries? No  Objective: CMP     Component Value Date/Time   NA 138 12/21/2022 1558   K 4.1 12/21/2022 1558   CL 105 12/21/2022 1558   CO2 23 12/21/2022 1558   GLUCOSE 81 12/21/2022 1558   BUN 11 12/21/2022 1558   CREATININE 0.77 12/21/2022 1558   CALCIUM 9.5 12/21/2022 1558   PROT 7.6 12/21/2022 1558   ALBUMIN 4.1 07/23/2020 0751   AST 17 12/21/2022 1558   ALT 13 12/21/2022 1558   ALKPHOS 47 07/23/2020 0751   BILITOT 0.6 12/21/2022 1558   GFRNONAA >60 07/23/2020 0751    CBC    Component Value Date/Time   WBC 7.4 12/21/2022 1558   RBC 4.43 12/21/2022 1558   HGB 13.1 12/21/2022 1558   HCT 38.6 12/21/2022 1558   PLT 217 12/21/2022 1558   MCV 87.1 12/21/2022 1558   MCH 29.6 12/21/2022 1558   MCHC 33.9 12/21/2022 1558   RDW 12.3 12/21/2022 1558   LYMPHSABS 1,524 12/21/2022 1558   EOSABS 111 12/21/2022 1558   BASOSABS 52 12/21/2022 1558    Baseline Immunosuppressant Therapy Labs TB GOLD    Latest Ref Rng & Units 11/09/2022    9:21 AM  Quantiferon TB Gold  Quantiferon TB Gold Plus NEGATIVE NEGATIVE    Hepatitis Panel    Latest Ref Rng & Units 11/03/2022    5:00 PM  Hepatitis  Hep B Surface Ag Negative Negative    HIV Lab Results  Component Value Date   HIV Non Reactive 11/03/2022   HIV NON-REACTIVE 04/30/2021   Immunoglobulins    Latest Ref Rng & Units 04/30/2021    9:45 AM  Immunoglobulin Electrophoresis  IgA  47 - 310 mg/dL 621   IgG 308 - 6,578 mg/dL 4,696   IgM 50 - 295 mg/dL 284    SPEP    Latest Ref Rng & Units 12/21/2022    3:58 PM  Serum Protein Electrophoresis  Total Protein 6.1 - 8.1 g/dL 7.6    X3KG No results found for: "G6PDH" TPMT No results found for: "TPMT"   Chest  x-ray: 07/23/2020 Lungs clear. Cardiac silhouette normal.   Assessment/Plan:   Administrations This Visit     certolizumab pegol (CIMZIA) kit 400 mg     Admin Date 01/04/2023 Action Given Dose 400 mg Route Subcutaneous Administered By Henriette Combs, LPN             Patient tolerated injection well.   Appointment for next injection scheduled for 01/18/2023.  Patient due for labs at next visit.  Patient is to call and reschedule appointment if running a fever with signs/symptoms of infection, on antibiotics for active infection or has an upcoming invasive procedure.  All questions encouraged and answered.  Instructed patient to call with any further questions or concerns.

## 2023-01-11 ENCOUNTER — Encounter: Payer: Self-pay | Admitting: Physician Assistant

## 2023-01-11 ENCOUNTER — Ambulatory Visit: Payer: Medicaid Other | Attending: Physician Assistant | Admitting: Physician Assistant

## 2023-01-11 VITALS — BP 106/72 | HR 91 | Resp 15 | Ht 66.0 in | Wt 124.8 lb

## 2023-01-11 DIAGNOSIS — H44113 Panuveitis, bilateral: Secondary | ICD-10-CM | POA: Diagnosis not present

## 2023-01-11 DIAGNOSIS — M722 Plantar fascial fibromatosis: Secondary | ICD-10-CM

## 2023-01-11 DIAGNOSIS — R5383 Other fatigue: Secondary | ICD-10-CM

## 2023-01-11 DIAGNOSIS — L401 Generalized pustular psoriasis: Secondary | ICD-10-CM

## 2023-01-11 DIAGNOSIS — M7918 Myalgia, other site: Secondary | ICD-10-CM

## 2023-01-11 DIAGNOSIS — Z79899 Other long term (current) drug therapy: Secondary | ICD-10-CM

## 2023-01-11 DIAGNOSIS — M450 Ankylosing spondylitis of multiple sites in spine: Secondary | ICD-10-CM | POA: Diagnosis not present

## 2023-01-11 DIAGNOSIS — M533 Sacrococcygeal disorders, not elsewhere classified: Secondary | ICD-10-CM

## 2023-01-11 DIAGNOSIS — R519 Headache, unspecified: Secondary | ICD-10-CM

## 2023-01-11 DIAGNOSIS — Z7952 Long term (current) use of systemic steroids: Secondary | ICD-10-CM

## 2023-01-11 DIAGNOSIS — G8929 Other chronic pain: Secondary | ICD-10-CM

## 2023-01-11 NOTE — Addendum Note (Signed)
Addended by: Ellen Henri on: 01/11/2023 10:34 AM   Modules accepted: Orders

## 2023-01-18 ENCOUNTER — Ambulatory Visit: Payer: Medicaid Other | Attending: Rheumatology | Admitting: *Deleted

## 2023-01-18 ENCOUNTER — Ambulatory Visit (INDEPENDENT_AMBULATORY_CARE_PROVIDER_SITE_OTHER): Payer: Medicaid Other | Admitting: Obstetrics and Gynecology

## 2023-01-18 VITALS — BP 106/74 | HR 76 | Wt 131.0 lb

## 2023-01-18 VITALS — BP 100/67 | HR 85

## 2023-01-18 DIAGNOSIS — M450 Ankylosing spondylitis of multiple sites in spine: Secondary | ICD-10-CM | POA: Diagnosis not present

## 2023-01-18 DIAGNOSIS — Z3042 Encounter for surveillance of injectable contraceptive: Secondary | ICD-10-CM | POA: Diagnosis not present

## 2023-01-18 DIAGNOSIS — Z79899 Other long term (current) drug therapy: Secondary | ICD-10-CM

## 2023-01-18 LAB — POCT URINE PREGNANCY: Preg Test, Ur: NEGATIVE

## 2023-01-18 MED ORDER — MEDROXYPROGESTERONE ACETATE 150 MG/ML IM SUSP: 150.0000 mg | INTRAMUSCULAR | 3 refills | Status: DC

## 2023-01-18 MED ORDER — CERTOLIZUMAB PEGOL 2 X 200 MG ~~LOC~~ KIT
400.0000 mg | PACK | Freq: Once | SUBCUTANEOUS | Status: AC
  Administered 2023-01-18: 400 mg via SUBCUTANEOUS

## 2023-01-18 MED ORDER — MEDROXYPROGESTERONE ACETATE 150 MG/ML IM SUSP
150.0000 mg | Freq: Once | INTRAMUSCULAR | Status: AC
  Administered 2023-01-18: 150 mg via INTRAMUSCULAR

## 2023-01-18 NOTE — Progress Notes (Signed)
  CC: depo shot consideration Subjective:    Patient ID: Bethany Li, female    DOB: [birth year], 32 y.o.   MRN: [id number]  HPI Pt seen for discussion of receiving depo provera shots or OCP.  Pt's preference is depo shot.  She will need contraception through a three month "methotrexate washout."  Pt will receive depo provera today.  Pt informed her insurance may not cover further depo provera injections.  Quick check showed some OCPs will be covered if needed.   Review of Systems     Objective:   Physical Exam Vitals:   01/18/23 0955  BP: 106/74  Pulse: 76         Assessment & Plan:   1. Encounter for Depo-Provera contraception See above. - POCT urine pregnancy - medroxyPROGESTERone (DEPO-PROVERA) 150 MG/ML injection; Inject 1 mL (150 mg total) into the muscle every 3 (three) months.  Dispense: 1 mL; Refill: 3   F/u in 3 months for next injection. I spent 10 minutes dedicated to the care of this patient including previsit review of records, face to face time with the patient discussing treatment options and post visit testing.  Warden Fillers, MD Faculty Attending, [facility]

## 2023-01-18 NOTE — Progress Notes (Signed)
Pt is in office for [facility] consult- interested in pills or Depo. LMP approx 12/27/22 Last intercourse 2 weeks ago.   Depo given today per provider approval. Injection given in LD, pt tolerated well. Pt advised to RTO for next injection- due 7/10-7/24/24. Pt advised to pick up Rx for future injections.  Pt had no other questions.    Administrations This Visit     medroxyPROGESTERone (DEPO-PROVERA) injection 150 mg     Admin Date 01/18/2023 Action Given Dose 150 mg Route Intramuscular Administered By Lanney Gins, CMA

## 2023-01-18 NOTE — Progress Notes (Signed)
Subjective:   Patient presents to clinic today to receive monthly dose of Cimzia.  Patient running a fever or have signs/symptoms of infection? No  Patient currently on antibiotics for the treatment of infection? No  Patient have any upcoming invasive procedures/surgeries? No  Objective: CMP     Component Value Date/Time   NA 138 12/21/2022 1558   K 4.1 12/21/2022 1558   CL 105 12/21/2022 1558   CO2 23 12/21/2022 1558   GLUCOSE 81 12/21/2022 1558   BUN 11 12/21/2022 1558   CREATININE 0.77 12/21/2022 1558   CALCIUM 9.5 12/21/2022 1558   PROT 7.6 12/21/2022 1558   ALBUMIN 4.1 07/23/2020 0751   AST 17 12/21/2022 1558   ALT 13 12/21/2022 1558   ALKPHOS 47 07/23/2020 0751   BILITOT 0.6 12/21/2022 1558   GFRNONAA >60 07/23/2020 0751    CBC    Component Value Date/Time   WBC 7.4 12/21/2022 1558   RBC 4.43 12/21/2022 1558   HGB 13.1 12/21/2022 1558   HCT 38.6 12/21/2022 1558   PLT 217 12/21/2022 1558   MCV 87.1 12/21/2022 1558   MCH 29.6 12/21/2022 1558   MCHC 33.9 12/21/2022 1558   RDW 12.3 12/21/2022 1558   LYMPHSABS 1,524 12/21/2022 1558   EOSABS 111 12/21/2022 1558   BASOSABS 52 12/21/2022 1558    Baseline Immunosuppressant Therapy Labs TB GOLD    Latest Ref Rng & Units 11/09/2022    9:21 AM  Quantiferon TB Gold  Quantiferon TB Gold Plus NEGATIVE NEGATIVE    Hepatitis Panel    Latest Ref Rng & Units 11/03/2022    5:00 PM  Hepatitis  Hep B Surface Ag Negative Negative    HIV Lab Results  Component Value Date   HIV Non Reactive 11/03/2022   HIV NON-REACTIVE 04/30/2021   Immunoglobulins    Latest Ref Rng & Units 04/30/2021    9:45 AM  Immunoglobulin Electrophoresis  IgA  47 - 310 mg/dL 161   IgG 096 - 0,454 mg/dL 0,981   IgM 50 - 191 mg/dL 478    SPEP    Latest Ref Rng & Units 12/21/2022    3:58 PM  Serum Protein Electrophoresis  Total Protein 6.1 - 8.1 g/dL 7.6    G9FA No results found for: "G6PDH" TPMT No results found for: "TPMT"   Chest  x-ray: 07/23/2020 Lungs clear. Cardiac silhouette normal.   Assessment/Plan:   Administrations This Visit     certolizumab pegol (CIMZIA) kit 400 mg     Admin Date 01/18/2023 Action Given Dose 400 mg Route Subcutaneous Administered By Henriette Combs, LPN             Patient tolerated injection well.   Appointment for next injection scheduled for 02/15/2023. Patient due for labs today and they were drawn in office.  Patient is to call and reschedule appointment if running a fever with signs/symptoms of infection, on antibiotics for active infection or has an upcoming invasive procedure.  All questions encouraged and answered.  Instructed patient to call with any further questions or concerns.

## 2023-01-19 LAB — CBC WITH DIFFERENTIAL/PLATELET
Absolute Monocytes: 488 cells/uL (ref 200–950)
Basophils Absolute: 28 cells/uL (ref 0–200)
Basophils Relative: 0.6 %
Eosinophils Absolute: 78 cells/uL (ref 15–500)
Eosinophils Relative: 1.7 %
HCT: 36.9 % (ref 35.0–45.0)
Hemoglobin: 12.3 g/dL (ref 11.7–15.5)
Lymphs Abs: 2088 cells/uL (ref 850–3900)
MCH: 29 pg (ref 27.0–33.0)
MCHC: 33.3 g/dL (ref 32.0–36.0)
MCV: 87 fL (ref 80.0–100.0)
MPV: 11.6 fL (ref 7.5–12.5)
Monocytes Relative: 10.6 %
Neutro Abs: 1918 cells/uL (ref 1500–7800)
Neutrophils Relative %: 41.7 %
Platelets: 195 10*3/uL (ref 140–400)
RBC: 4.24 10*6/uL (ref 3.80–5.10)
RDW: 12.3 % (ref 11.0–15.0)
Total Lymphocyte: 45.4 %
WBC: 4.6 10*3/uL (ref 3.8–10.8)

## 2023-01-19 LAB — COMPLETE METABOLIC PANEL WITH GFR
AG Ratio: 1.6 (calc) (ref 1.0–2.5)
ALT: 16 U/L (ref 6–29)
AST: 17 U/L (ref 10–30)
Albumin: 4.4 g/dL (ref 3.6–5.1)
Alkaline phosphatase (APISO): 36 U/L (ref 31–125)
BUN: 10 mg/dL (ref 7–25)
CO2: 22 mmol/L (ref 20–32)
Calcium: 9.3 mg/dL (ref 8.6–10.2)
Chloride: 107 mmol/L (ref 98–110)
Creat: 0.75 mg/dL (ref 0.50–0.97)
Globulin: 2.7 g/dL (calc) (ref 1.9–3.7)
Glucose, Bld: 88 mg/dL (ref 65–99)
Potassium: 4.1 mmol/L (ref 3.5–5.3)
Sodium: 138 mmol/L (ref 135–146)
Total Bilirubin: 0.7 mg/dL (ref 0.2–1.2)
Total Protein: 7.1 g/dL (ref 6.1–8.1)
eGFR: 109 mL/min/{1.73_m2} (ref >=60)

## 2023-01-19 NOTE — Progress Notes (Signed)
CBC and CMP WNL

## 2023-02-02 ENCOUNTER — Telehealth: Payer: Self-pay | Admitting: Dermatology

## 2023-02-02 NOTE — Telephone Encounter (Signed)
Left a message for appointment scheduling

## 2023-02-15 ENCOUNTER — Ambulatory Visit: Payer: Medicaid Other | Attending: Rheumatology | Admitting: *Deleted

## 2023-02-15 VITALS — BP 131/82 | HR 106

## 2023-02-15 DIAGNOSIS — M450 Ankylosing spondylitis of multiple sites in spine: Secondary | ICD-10-CM

## 2023-02-15 MED ORDER — CERTOLIZUMAB PEGOL 2 X 200 MG ~~LOC~~ KIT
400.0000 mg | PACK | Freq: Once | SUBCUTANEOUS | Status: AC
  Administered 2023-02-15: 400 mg via SUBCUTANEOUS

## 2023-02-15 NOTE — Progress Notes (Signed)
Pharmacy Note  Subjective:   Patient presents to clinic today to receive monthly dose of Cimzia.  Patient running a fever or have signs/symptoms of infection? No  Patient currently on antibiotics for the treatment of infection? No  Patient have any upcoming invasive procedures/surgeries? No  Objective: CMP     Component Value Date/Time   NA 138 01/18/2023 1512   K 4.1 01/18/2023 1512   CL 107 01/18/2023 1512   CO2 22 01/18/2023 1512   GLUCOSE 88 01/18/2023 1512   BUN 10 01/18/2023 1512   CREATININE 0.75 01/18/2023 1512   CALCIUM 9.3 01/18/2023 1512   PROT 7.1 01/18/2023 1512   ALBUMIN 4.1 07/23/2020 0751   AST 17 01/18/2023 1512   ALT 16 01/18/2023 1512   ALKPHOS 47 07/23/2020 0751   BILITOT 0.7 01/18/2023 1512   GFRNONAA >60 07/23/2020 0751    CBC    Component Value Date/Time   WBC 4.6 01/18/2023 1512   RBC 4.24 01/18/2023 1512   HGB 12.3 01/18/2023 1512   HCT 36.9 01/18/2023 1512   PLT 195 01/18/2023 1512   MCV 87.0 01/18/2023 1512   MCH 29.0 01/18/2023 1512   MCHC 33.3 01/18/2023 1512   RDW 12.3 01/18/2023 1512   LYMPHSABS 2,088 01/18/2023 1512   EOSABS 78 01/18/2023 1512   BASOSABS 28 01/18/2023 1512    Baseline Immunosuppressant Therapy Labs TB GOLD    Latest Ref Rng & Units 11/09/2022    9:21 AM  Quantiferon TB Gold  Quantiferon TB Gold Plus NEGATIVE NEGATIVE    Hepatitis Panel    Latest Ref Rng & Units 11/03/2022    5:00 PM  Hepatitis  Hep B Surface Ag Negative Negative    HIV Lab Results  Component Value Date   HIV Non Reactive 11/03/2022   HIV NON-REACTIVE 04/30/2021   Immunoglobulins    Latest Ref Rng & Units 04/30/2021    9:45 AM  Immunoglobulin Electrophoresis  IgA  47 - 310 mg/dL 161   IgG 096 - 0,454 mg/dL 0,981   IgM 50 - 191 mg/dL 478    SPEP    Latest Ref Rng & Units 01/18/2023    3:12 PM  Serum Protein Electrophoresis  Total Protein 6.1 - 8.1 g/dL 7.1    G9FA No results found for: "G6PDH" TPMT No results found for:  "TPMT"   Chest x-ray: 07/23/2020 Lungs clear. Cardiac silhouette normal.   Assessment/Plan:   Administrations This Visit     certolizumab pegol (CIMZIA) kit 400 mg     Admin Date 02/15/2023 Action Given Dose 400 mg Route Subcutaneous Administered By Henriette Combs, LPN            Patient tolerated injection well.   Appointment for next injection scheduled for 03/15/2023.  Patient due for labs in July 2024.  Patient is to call and reschedule appointment if running a fever with signs/symptoms of infection, on antibiotics for active infection or has an upcoming invasive procedure.  All questions encouraged and answered.  Instructed patient to call with any further questions or concerns.

## 2023-02-28 ENCOUNTER — Telehealth: Payer: Self-pay | Admitting: *Deleted

## 2023-02-28 NOTE — Telephone Encounter (Signed)
Patient states she is having Eyelid lift surgery on Right eye on 03/10/2023. Patient is on Cimzia and had her last injection on 02/15/2023. She will be due for her next injection on 03/15/2023. Will she need to delay her Cimzia injection.

## 2023-02-28 NOTE — Telephone Encounter (Signed)
Attempted to contact the patient and left message for patient to call the office.  

## 2023-02-28 NOTE — Telephone Encounter (Signed)
She should hold Cimzia injection for a month prior to the surgery and may resume 2 weeks after the surgery if there is no infection.

## 2023-03-01 NOTE — Telephone Encounter (Signed)
Patient stopped by the office to drop off paperwork to be completed. While in office patient advised she should hold Cimzia injection for a month prior to the surgery and may resume 2 weeks after the surgery if there is no infection. Patient states she will reach out to her surgeon to advise.

## 2023-03-07 NOTE — Telephone Encounter (Signed)
Patient contacted the office and states she will email her fax cover sheet for her paperwork to [place].

## 2023-03-08 NOTE — Progress Notes (Deleted)
Office Visit Note  Patient: Bethany Li             Date of Birth: 1991-01-02           MRN: [id number]             PCP: Jackie Plum, MD Referring: Jackie Plum, MD Visit Date: 03/22/2023 Occupation: @GUAROCC @  Subjective:     History of Present Illness: Bethany Li is a 32 y.o. female with history of ankylosing spondylitis and panuveitis.  Patient is currently receiving in-office administered cimzia. Her last dose of cimzia was on 02/15/23. She is planning pregnancy.   Upcoming surgery   CBC and CMP updated on 01/18/23.  TB Gold negative on 11/09/22.  Discussed the importance of holding cimzia if she develops signs or symptoms of an infection and to resume once the infection has completely cleared.   Activities of Daily Living:  Patient reports morning stiffness for *** {minute/hour:19697}.   Patient {ACTIONS;DENIES/REPORTS:21021675::"Denies"} nocturnal pain.  Difficulty dressing/grooming: {ACTIONS;DENIES/REPORTS:21021675::"Denies"} Difficulty climbing stairs: {ACTIONS;DENIES/REPORTS:21021675::"Denies"} Difficulty getting out of chair: {ACTIONS;DENIES/REPORTS:21021675::"Denies"} Difficulty using hands for taps, buttons, cutlery, and/or writing: {ACTIONS;DENIES/REPORTS:21021675::"Denies"}  No Rheumatology ROS completed.   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   Objective: Vital Signs: There were no vitals taken for this visit.   Physical Exam Vitals and nursing note reviewed.  Constitutional:      Appearance: She is well-developed.  HENT:     Head: Normocephalic and atraumatic.  Eyes:     Conjunctiva/sclera: Conjunctivae normal.  Cardiovascular:     Rate and Rhythm: Normal rate and regular rhythm.     Heart sounds: Normal heart sounds.  Pulmonary:     Effort: Pulmonary effort is normal.     Breath sounds: Normal breath sounds.  Abdominal:     General: Bowel sounds are normal.     Palpations: Abdomen is soft.  Musculoskeletal:     Cervical back: Normal range of motion.  Lymphadenopathy:     Cervical: No cervical adenopathy.  Skin:    General: Skin is warm and dry.     Capillary Refill: Capillary refill takes less than 2 seconds.  Neurological:     Mental Status: She is alert and oriented to person, place, and time.  Psychiatric:        Behavior: Behavior normal.      Musculoskeletal Exam: ***  CDAI Exam: CDAI Score: -- Patient Global: --; Provider Global: -- Swollen: --; Tender: -- Joint Exam 03/22/2023   No joint exam has been documented for this visit   There is currently no information documented on the homunculus. Go to the Rheumatology activity and complete the homunculus joint exam.  Investigation: No additional  findings.  Imaging: No results found.  Recent Labs: Lab Results  Component Value Date   WBC 4.6 01/18/2023   HGB 12.3 01/18/2023   PLT 195 01/18/2023   NA 138 01/18/2023   K 4.1 01/18/2023   CL 107 01/18/2023   CO2 22 01/18/2023   GLUCOSE 88 01/18/2023   BUN 10 01/18/2023   CREATININE 0.75 01/18/2023   BILITOT 0.7 01/18/2023   ALKPHOS 47 07/23/2020   AST 17 01/18/2023   ALT 16 01/18/2023   PROT 7.1 01/18/2023   ALBUMIN 4.1 07/23/2020   CALCIUM 9.3 01/18/2023   QFTBGOLDPLUS NEGATIVE 11/09/2022    Speciality  Comments: Diagnosed in [place].  Treated with Remicade, Humira and Simponi per patient.    Appointment every 3 months  Procedures:  No procedures performed Allergies: Patient has no known allergies.   Assessment / Plan:     Visit Diagnoses: Ankylosing spondylitis of multiple sites in spine (HCC)  High risk medication use  Panuveitis of both eyes  Pustular psoriasis  Long term (current) use of systemic steroids  Chronic SI joint pain  Chronic nonintractable headache, unspecified headache type  Myofascial pain  Other fatigue  Plantar fasciitis, bilateral  Orders: No orders of the defined types were placed in this encounter.  No orders of the defined types were placed in this encounter.   Face-to-face time spent with patient was *** minutes. Greater than 50% of time was spent in counseling and coordination of care.  Follow-Up Instructions: No follow-ups on file.   Gearldine Bienenstock, PA-C  Note - This record has been created using Dragon software.  Chart creation errors have been sought, but may not always  have been located. Such creation errors do not reflect on  the standard of medical care.

## 2023-03-22 ENCOUNTER — Ambulatory Visit: Payer: Medicaid Other | Admitting: Physician Assistant

## 2023-03-22 DIAGNOSIS — M722 Plantar fascial fibromatosis: Secondary | ICD-10-CM

## 2023-03-22 DIAGNOSIS — R519 Headache, unspecified: Secondary | ICD-10-CM

## 2023-03-22 DIAGNOSIS — M7918 Myalgia, other site: Secondary | ICD-10-CM

## 2023-03-22 DIAGNOSIS — Z79899 Other long term (current) drug therapy: Secondary | ICD-10-CM

## 2023-03-22 DIAGNOSIS — G8929 Other chronic pain: Secondary | ICD-10-CM

## 2023-03-22 DIAGNOSIS — M450 Ankylosing spondylitis of multiple sites in spine: Secondary | ICD-10-CM

## 2023-03-22 DIAGNOSIS — Z7952 Long term (current) use of systemic steroids: Secondary | ICD-10-CM

## 2023-03-22 DIAGNOSIS — L401 Generalized pustular psoriasis: Secondary | ICD-10-CM

## 2023-03-22 DIAGNOSIS — H44113 Panuveitis, bilateral: Secondary | ICD-10-CM

## 2023-03-22 DIAGNOSIS — R5383 Other fatigue: Secondary | ICD-10-CM

## 2023-04-03 ENCOUNTER — Telehealth: Payer: Self-pay | Admitting: Rheumatology

## 2023-04-03 NOTE — Telephone Encounter (Unsigned)
Patient called requesting a return call to schedule Cimzia injection.

## 2023-04-04 NOTE — Telephone Encounter (Signed)
Attempted to contact the patient and left message for patient to call the office.  

## 2023-04-04 NOTE — Telephone Encounter (Signed)
Patient returned call to the office. Patient states she has been cleared by her surgeon to restart Cimzia. Patient advised we would need clearance letter faxed to our office. Patient states she will contact them today and have them send it to our office. Patient advised once we receive it I will reach out to schedule her injection.

## 2023-04-12 ENCOUNTER — Telehealth: Payer: Self-pay | Admitting: Rheumatology

## 2023-04-12 ENCOUNTER — Ambulatory Visit: Payer: Medicaid Other

## 2023-04-12 NOTE — Telephone Encounter (Signed)
LMOM for patient to call and schedule follow-up appointment.   °

## 2023-04-18 ENCOUNTER — Telehealth: Payer: Self-pay | Admitting: Rheumatology

## 2023-04-18 ENCOUNTER — Ambulatory Visit: Payer: Medicaid Other

## 2023-04-18 NOTE — Telephone Encounter (Signed)
Spoke with patient and she states she saw [employer]. Patient states she saw Dr. Darcel Bayley who left their office about 3 weeks ago.   [phone]  Left message for Morrie Sheldon as [facility] to obtain verbal clearance to restart patient on Cimzia.

## 2023-04-18 NOTE — Telephone Encounter (Signed)
Patient left a voicemail stating she was returning Andrea's call to schedule Cimzia injection.

## 2023-04-18 NOTE — Telephone Encounter (Signed)
Please ask patient who the surgeon.  We can get verbal clearance from their office.

## 2023-04-18 NOTE — Telephone Encounter (Signed)
Returned call to patient. Patient advised we have not received clearance from her surgeon allowing her to resume the Cimzia. Patient states she had the surgery over a month ago. Patient states she has called the surgeon's office, she has went up there twice and has emailed them requesting the clearance letter to be sent to them. Patient would like to know what else she should do in order to restart her Cimzia. Please advise.

## 2023-04-19 ENCOUNTER — Ambulatory Visit (INDEPENDENT_AMBULATORY_CARE_PROVIDER_SITE_OTHER): Payer: Medicaid Other | Admitting: *Deleted

## 2023-04-19 VITALS — Wt 130.0 lb

## 2023-04-19 DIAGNOSIS — Z3042 Encounter for surveillance of injectable contraceptive: Secondary | ICD-10-CM | POA: Diagnosis not present

## 2023-04-19 MED ORDER — MEDROXYPROGESTERONE ACETATE 150 MG/ML IM SUSP
150.0000 mg | Freq: Once | INTRAMUSCULAR | Status: AC
  Administered 2023-04-19: 150 mg via INTRAMUSCULAR

## 2023-04-19 NOTE — Progress Notes (Signed)
Date last pap: 11/03/22. Last Depo-Provera: 01/18/23. Side Effects if any: none. Depo-Provera 150 mg IM given by: S.Carolie Mcilrath, CMA in . Next appointment due 10/9-23/24.    Administrations This Visit     medroxyPROGESTERone (DEPO-PROVERA) injection 150 mg     Admin Date 04/19/2023 Action Given Dose 150 mg Route Intramuscular Documented By Lanney Gins, CMA

## 2023-04-19 NOTE — Telephone Encounter (Signed)
Received message from [place], [place] at [facility]. Verbal clearance given from patient to restart Cimzia.   Reached out to patient to schedule Cimzia injection for 04/27/2023

## 2023-04-25 ENCOUNTER — Other Ambulatory Visit: Payer: Self-pay

## 2023-04-27 ENCOUNTER — Ambulatory Visit: Payer: Medicaid Other | Attending: Rheumatology | Admitting: *Deleted

## 2023-04-27 VITALS — BP 129/83 | HR 92

## 2023-04-27 DIAGNOSIS — M450 Ankylosing spondylitis of multiple sites in spine: Secondary | ICD-10-CM

## 2023-04-27 DIAGNOSIS — Z79899 Other long term (current) drug therapy: Secondary | ICD-10-CM

## 2023-04-27 MED ORDER — CERTOLIZUMAB PEGOL 2 X 200 MG ~~LOC~~ KIT
400.0000 mg | PACK | Freq: Once | SUBCUTANEOUS | Status: AC
  Administered 2023-04-27: 400 mg via SUBCUTANEOUS

## 2023-04-27 NOTE — Progress Notes (Signed)
Subjective:   Patient presents to clinic today to receive monthly dose of Cimzia.  Patient running a fever or have signs/symptoms of infection? No  Patient currently on antibiotics for the treatment of infection? No  Patient have any upcoming invasive procedures/surgeries? No  Objective: CMP     Component Value Date/Time   NA 138 01/18/2023 1512   K 4.1 01/18/2023 1512   CL 107 01/18/2023 1512   CO2 22 01/18/2023 1512   GLUCOSE 88 01/18/2023 1512   BUN 10 01/18/2023 1512   CREATININE 0.75 01/18/2023 1512   CALCIUM 9.3 01/18/2023 1512   PROT 7.1 01/18/2023 1512   ALBUMIN 4.1 07/23/2020 0751   AST 17 01/18/2023 1512   ALT 16 01/18/2023 1512   ALKPHOS 47 07/23/2020 0751   BILITOT 0.7 01/18/2023 1512   GFRNONAA >60 07/23/2020 0751    CBC    Component Value Date/Time   WBC 4.6 01/18/2023 1512   RBC 4.24 01/18/2023 1512   HGB 12.3 01/18/2023 1512   HCT 36.9 01/18/2023 1512   PLT 195 01/18/2023 1512   MCV 87.0 01/18/2023 1512   MCH 29.0 01/18/2023 1512   MCHC 33.3 01/18/2023 1512   RDW 12.3 01/18/2023 1512   LYMPHSABS 2,088 01/18/2023 1512   EOSABS 78 01/18/2023 1512   BASOSABS 28 01/18/2023 1512    Baseline Immunosuppressant Therapy Labs TB GOLD    Latest Ref Rng & Units 11/09/2022    9:21 AM  Quantiferon TB Gold  Quantiferon TB Gold Plus NEGATIVE NEGATIVE    Hepatitis Panel    Latest Ref Rng & Units 11/03/2022    5:00 PM  Hepatitis  Hep B Surface Ag Negative Negative    HIV Lab Results  Component Value Date   HIV Non Reactive 11/03/2022   HIV NON-REACTIVE 04/30/2021   Immunoglobulins    Latest Ref Rng & Units 04/30/2021    9:45 AM  Immunoglobulin Electrophoresis  IgA  47 - 310 mg/dL 811   IgG 914 - 7,829 mg/dL 5,621   IgM 50 - 308 mg/dL 657    SPEP    Latest Ref Rng & Units 01/18/2023    3:12 PM  Serum Protein Electrophoresis  Total Protein 6.1 - 8.1 g/dL 7.1    Q4ON No results found for: "G6PDH" TPMT No results found for: "TPMT"   Chest  x-ray: 07/23/2020 Lungs clear. Cardiac silhouette normal.   Assessment/Plan:   Administrations This Visit     certolizumab pegol (CIMZIA) kit 400 mg     Admin Date 04/27/2023 Action Given Dose 400 mg Route Subcutaneous Documented By Henriette Combs, LPN             Patient tolerated injection well.   Appointment for next injection scheduled for 05/25/2023.  Patient due for labs today and were drawn while in office.  Patient is to call and reschedule appointment if running a fever with signs/symptoms of infection, on antibiotics for active infection or has an upcoming invasive procedure.  All questions encouraged and answered.  Instructed patient to call with any further questions or concerns.

## 2023-04-28 ENCOUNTER — Telehealth: Payer: Self-pay

## 2023-04-28 DIAGNOSIS — Z79899 Other long term (current) drug therapy: Secondary | ICD-10-CM

## 2023-04-28 DIAGNOSIS — R7989 Other specified abnormal findings of blood chemistry: Secondary | ICD-10-CM

## 2023-04-28 NOTE — Telephone Encounter (Signed)
-----   Message from [facility] sent at 04/28/2023 12:51 PM EDT ----- CBC is normal.  CMP shows elevated liver functions.  Patient should avoid all NSAIDs and should not drink any alcohol.  Will recheck LFTs in 3 months.

## 2023-04-28 NOTE — Telephone Encounter (Signed)
Patient inquires what reasons her liver functions would be elevated because she does not take NSAIDS. Please advise.  Please review lab and sign.

## 2023-04-28 NOTE — Telephone Encounter (Signed)
Please clarify if she has had any medication changes? Tylenol use? Alcohol use? Recent infections?

## 2023-04-28 NOTE — Progress Notes (Signed)
CBC is normal.  CMP shows elevated liver functions.  Patient should avoid all NSAIDs and should not drink any alcohol.  Will recheck LFTs in 3 months.

## 2023-04-28 NOTE — Telephone Encounter (Signed)
Please clarify if she has had any medication changes? Patient states she has not had any medication changes.  Tylenol use? Patient states she does not take tylenol and has not for months. Alcohol use? Patient states she has been out more drinking more. Patient states she is usually a wine person but has been drinking liquor recently. Patient states she has been partying more during this summer.  Recent infections? Patient states she has not had any recent infections.   Please advise.

## 2023-04-29 NOTE — Telephone Encounter (Signed)
Patient should try to limit alcohol use and have hepatic function panel rechecked in 1 month

## 2023-05-01 NOTE — Telephone Encounter (Signed)
Attempted to contact the patient and left message for patient to call the office.  

## 2023-05-02 NOTE — Telephone Encounter (Signed)
Attempted to contact the patient and left message for patient to call the office.  

## 2023-05-03 NOTE — Telephone Encounter (Signed)
Patient advised she should try to limit alcohol use and have hepatic function panel rechecked in 1 month. Patient expressed understanding.

## 2023-05-03 NOTE — Telephone Encounter (Signed)
Attempted to contact the patient and left message for patient to call the office.  

## 2023-05-11 ENCOUNTER — Ambulatory Visit: Payer: Medicaid Other | Admitting: Dermatology

## 2023-05-25 ENCOUNTER — Ambulatory Visit: Payer: Medicaid Other

## 2023-05-25 ENCOUNTER — Other Ambulatory Visit (HOSPITAL_COMMUNITY): Payer: Self-pay

## 2023-06-08 ENCOUNTER — Ambulatory Visit: Payer: Medicaid Other | Attending: Rheumatology | Admitting: *Deleted

## 2023-06-08 VITALS — BP 127/86 | HR 89

## 2023-06-08 DIAGNOSIS — M450 Ankylosing spondylitis of multiple sites in spine: Secondary | ICD-10-CM | POA: Diagnosis not present

## 2023-06-08 MED ORDER — CERTOLIZUMAB PEGOL 2 X 200 MG ~~LOC~~ KIT
400.0000 mg | PACK | Freq: Once | SUBCUTANEOUS | Status: AC
  Administered 2023-06-08: 400 mg via SUBCUTANEOUS

## 2023-06-08 NOTE — Progress Notes (Signed)
Subjective:   Patient presents to clinic today to receive monthly dose of Cimzia.  Patient running a fever or have signs/symptoms of infection? No  Patient currently on antibiotics for the treatment of infection? No  Patient have any upcoming invasive procedures/surgeries? No  Objective: CMP     Component Value Date/Time   NA 138 04/27/2023 1452   K 4.3 04/27/2023 1452   CL 107 04/27/2023 1452   CO2 19 (L) 04/27/2023 1452   GLUCOSE 80 04/27/2023 1452   BUN 12 04/27/2023 1452   CREATININE 0.74 04/27/2023 1452   CALCIUM 9.6 04/27/2023 1452   PROT 7.7 04/27/2023 1452   ALBUMIN 4.1 07/23/2020 0751   AST 31 (H) 04/27/2023 1452   ALT 39 (H) 04/27/2023 1452   ALKPHOS 47 07/23/2020 0751   BILITOT 0.6 04/27/2023 1452   GFRNONAA >60 07/23/2020 0751    CBC    Component Value Date/Time   WBC 6.2 04/27/2023 1452   RBC 4.29 04/27/2023 1452   HGB 12.6 04/27/2023 1452   HCT 37.3 04/27/2023 1452   PLT 214 04/27/2023 1452   MCV 86.9 04/27/2023 1452   MCH 29.4 04/27/2023 1452   MCHC 33.8 04/27/2023 1452   RDW 13.6 04/27/2023 1452   LYMPHSABS 2,015 04/27/2023 1452   EOSABS 112 04/27/2023 1452   BASOSABS 62 04/27/2023 1452    Baseline Immunosuppressant Therapy Labs TB GOLD    Latest Ref Rng & Units 11/09/2022    9:21 AM  Quantiferon TB Gold  Quantiferon TB Gold Plus NEGATIVE NEGATIVE    Hepatitis Panel    Latest Ref Rng & Units 11/03/2022    5:00 PM  Hepatitis  Hep B Surface Ag Negative Negative    HIV Lab Results  Component Value Date   HIV Non Reactive 11/03/2022   HIV NON-REACTIVE 04/30/2021   Immunoglobulins    Latest Ref Rng & Units 04/30/2021    9:45 AM  Immunoglobulin Electrophoresis  IgA  47 - 310 mg/dL 253   IgG 664 - 4,034 mg/dL 7,425   IgM 50 - 956 mg/dL 387    SPEP    Latest Ref Rng & Units 04/27/2023    2:52 PM  Serum Protein Electrophoresis  Total Protein 6.1 - 8.1 g/dL 7.7    F6EP No results found for: "G6PDH" TPMT No results found for: "TPMT"    Chest x-ray: 07/23/2020 Lungs clear. Cardiac silhouette normal.   Assessment/Plan:   Administrations This Visit     certolizumab pegol (CIMZIA) kit 400 mg     Admin Date 06/08/2023 Action Given Dose 400 mg Route Subcutaneous Documented By Henriette Combs, LPN             Patient tolerated injection well.   Appointment for next injection scheduled for 07/06/2023.  Patient due for labs in November 2024.  Patient is to call and reschedule appointment if running a fever with signs/symptoms of infection, on antibiotics for active infection or has an upcoming invasive procedure.  All questions encouraged and answered.  Instructed patient to call with any further questions or concerns.

## 2023-06-30 NOTE — Progress Notes (Unsigned)
Office Visit Note  Patient: Bethany Li             Date of Birth: [birth year]           MRN: [id number]             PCP: Jackie Plum, MD Referring: Jackie Plum, MD Visit Date: 07/13/2023 Occupation: @GUAROCC @  Subjective:  Medication monitoring   History of Present Illness: Bethany Li is a 32 y.o. female with history of ankylosing spondylitis and panuveitis.  She was started on in-office administered cimzia on 12/21/22.  She has been tolerating Cimzia without any side effects or injection site reactions.  Patient states that she has had a job change and will no longer be able to come to the office for administration of Cimzia.  She would like to switch to the at home administered subcutaneous injections of Cimzia.  She has found Cimzia to be effective at managing her symptoms.  She denies any recent ankylosing spondylitis flares.  She denies any SI joint pain at this time.  Patient remains on prednisone 5 mg 1 tablet daily.  Patient states that she only takes prednisone a few days a week.  She has been taking sea moss and turmeric for anti-inflammatory properties.  She denies any eye pain, photophobia, or eye redness.  She has floaters in her eyes at times. She denies any recent or recurrent infections.   Activities of Daily Living:  Patient reports morning stiffness for a few minutes.   Patient Reports nocturnal pain.  Difficulty dressing/grooming: Denies Difficulty climbing stairs: Reports Difficulty getting out of chair: Denies Difficulty using hands for taps, buttons, cutlery, and/or writing: Reports  Review of Systems  Constitutional:  Negative for fatigue.  HENT:  Negative for mouth sores and mouth dryness.   Eyes:  Negative for dryness.  Respiratory:  Negative for shortness of breath.   Cardiovascular:  Negative for chest pain and palpitations.  Gastrointestinal:  Negative for blood in stool, constipation and diarrhea.  Endocrine: Negative for  increased urination.  Genitourinary:  Negative for involuntary urination.  Musculoskeletal:  Positive for joint pain, joint pain and morning stiffness. Negative for gait problem, joint swelling, myalgias, muscle weakness, muscle tenderness and myalgias.  Skin:  Negative for color change, rash, hair loss and sensitivity to sunlight.  Allergic/Immunologic: Positive for susceptible to infections.  Neurological:  Positive for numbness and headaches. Negative for dizziness.  Hematological:  Negative for swollen glands.  Psychiatric/Behavioral:  Positive for sleep disturbance. Negative for depressed mood. The patient is nervous/anxious.     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   Objective: Vital Signs: BP 108/77 (BP Location: Left Arm, Patient Position: Sitting, Cuff  Size: Normal)   Pulse 91   Resp 14   Ht 5\' 6"  (1.676 m)   Wt 142 lb 9.6 oz (64.7 kg)   BMI 23.02 kg/m    Physical Exam Vitals and nursing note reviewed.  Constitutional:      Appearance: She is well-developed.  HENT:     Head: Normocephalic and atraumatic.  Eyes:     Conjunctiva/sclera: Conjunctivae normal.  Cardiovascular:     Rate and Rhythm: Normal rate and regular rhythm.     Heart sounds: Normal heart sounds.  Pulmonary:     Effort: Pulmonary effort  is normal.     Breath sounds: Normal breath sounds.  Abdominal:     General: Bowel sounds are normal.     Palpations: Abdomen is soft.  Musculoskeletal:     Cervical back: Normal range of motion.  Lymphadenopathy:     Cervical: No cervical adenopathy.  Skin:    General: Skin is warm and dry.     Capillary Refill: Capillary refill takes less than 2 seconds.  Neurological:     Mental Status: She is alert and oriented to person, place, and time.  Psychiatric:        Behavior: Behavior normal.      Musculoskeletal Exam: C-spine, thoracic spine, lumbar spine have good range of motion.  No midline spinal tenderness.  No SI joint tenderness.  Shoulder joints, elbow joints, wrist joints, MCPs, PIPs, DIPs have good range of motion with no synovitis.  Complete fist formation bilaterally.  Hip joints have good range of motion with no groin pain.  Knee joints have good range of motion with no warmth or effusion.  Ankle joints have good range of motion with no tenderness or joint swelling. No evidence of achilles tendonitis.   CDAI Exam: CDAI Score: -- Patient Global: --; Provider Global: -- Swollen: --; Tender: -- Joint Exam 07/13/2023   No joint exam has been documented for this visit   There is currently no information documented on the homunculus. Go to the Rheumatology activity and complete the homunculus joint exam.  Investigation: No additional findings.  Imaging: No results found.  Recent Labs: Lab Results  Component Value Date   WBC 6.2 04/27/2023   HGB 12.6 04/27/2023   PLT 214 04/27/2023   NA 138 04/27/2023   K 4.3 04/27/2023   CL 107 04/27/2023   CO2 19 (L) 04/27/2023   GLUCOSE 80 04/27/2023   BUN 12 04/27/2023   CREATININE 0.74 04/27/2023   BILITOT 0.6 04/27/2023   ALKPHOS 47 07/23/2020   AST 31 (H) 04/27/2023   ALT 39 (H) 04/27/2023   PROT 7.7 04/27/2023   ALBUMIN 4.1 07/23/2020   CALCIUM 9.6 04/27/2023   QFTBGOLDPLUS NEGATIVE 11/09/2022    Speciality  Comments: Diagnosed in [place].  Treated with Remicade, Humira and Simponi per patient.    Appointment every 3 months  Procedures:  No procedures performed Allergies: Patient has no known allergies.    Assessment / Plan:     Visit Diagnoses: Ankylosing spondylitis of multiple sites in spine [facility]): She has not had any signs or symptoms of a flare.  She has no midline spinal tenderness or SI joint tenderness upon palpation today.  No evidence of Achilles tendinitis or plantar fasciitis currently.  No synovitis or dactylitis noted.  She has not had any recent uveitis flares.  No active pustular psoriasis lesions.  She was initiated on in-office administered Cimzia on 12/21/2022.  She has been tolerating Cimzia without any  side effects or injection site reactions.  Her most recent dose of an office administered Cimzia was administered on 07/06/2023.  She has had a change in jobs and is unable to come to the office for monthly injections and would like to switch to at home administered subcutaneous injections.  We will start the approval process. Plan to also try tapering the patient off of prednisone.  She has been on prednisone 5 mg daily for several years.  According to the patient she does not take prednisone on a daily basis and only takes it a few days a week.  Plan on reducing prednisone to 4 mg daily and will be tapering by 1 mg every month if possible.  She was vies notify us if she develops any new or worsening symptoms.  She will follow-up in the office in 3 months or sooner if needed.  High risk medication use - Started on in-office administered cimzia on 12/21/22. She will be switching to at home sq Cimzia injections once monthly.  Discontinued taltz and Rasuvo. Considering pregnancy in the future-discontinued rasuvo.  She discontinued Taltz in January 2024 and Rasuvo in February 2024. CBC and CMP updated on 04/27/23.  She will return for updated lab work in November.  Standing orders for CBC  and CMP remain in place. TB gold negative on 11/09/22.  No recent or recurrent infections.  Discussed the importance of holding cimzia if she develops signs or symptoms of an infection and to resume once the infection has completely cleared.   Elevated LFTs: AST was 31 and ALT was 39 on 04/27/2023.  Patient declined to have updated lab work today but plans on returning at the beginning of November to have updated lab work drawn.  Panuveitis of both eyes: Followed closely by ophthalmology.  No signs or symptoms of a flare recently.  No eye pain, conjunctival injection, or photophobia.  Patient will remain on Cimzia monthly injections.  She will notify us if she develops any signs or symptoms of a flare.  Pustular psoriasis: No active pustular psoriasis lesions.   Long term (current) use of systemic steroids - She has been prescribed prednisone 5 mg 1 tablet daily for many years.  Patient is aware of the risks of long-term prednisone use.  Patient states that she has been taking 5 mg a few days a week but not on a daily basis.  She states that she has been taking turmeric and CMOS for the anti-inflammatory properties.  She is open to trying to reduce prednisone gradually.  A new prescription for prednisone 1 mg tablets will be sent to the pharmacy.  She will take prednisone 4 mg daily x 1 month then reduce to 3 mg daily x 1 month, 2 mg daily x 1 month, 1 mg daily x 1 month and then will discontinue if able.  Chronic SI joint pain: She has no SI joint pain at this time.  No tenderness upon palpation today.  Other medical conditions are listed as follows:   Chronic nonintractable headache, unspecified headache type  Myofascial pain: No recent flares.   Other fatigue: Stable.   Plantar fasciitis, bilateral: Not currently symptomatic.    Orders: No orders of the defined types were placed in this encounter.  No orders of the defined types were placed in this encounter.   Follow-Up Instructions:  Return in about 3 months (around 10/13/2023) for Ankylosing Spondylitis.   Gearldine Bienenstock, PA-C  Note - This record has been created using  Animal nutritionist.  Chart creation errors have been sought, but may not always  have been located. Such creation errors do not reflect on  the standard of medical care.

## 2023-07-05 ENCOUNTER — Ambulatory Visit: Payer: Medicaid Other | Admitting: Emergency Medicine

## 2023-07-05 VITALS — Wt 135.8 lb

## 2023-07-05 DIAGNOSIS — Z3042 Encounter for surveillance of injectable contraceptive: Secondary | ICD-10-CM

## 2023-07-05 MED ORDER — MEDROXYPROGESTERONE ACETATE 150 MG/ML IM SUSP
150.0000 mg | Freq: Once | INTRAMUSCULAR | Status: AC
  Administered 2023-07-05: 150 mg via INTRAMUSCULAR

## 2023-07-05 NOTE — Progress Notes (Signed)
Date last pap: 11/03/2022. Last Depo-Provera: 04/19/2023. Side Effects if any: NA. Serum HCG indicated? NA. Depo-Provera 150 mg IM given by: Resa Miner into LD, tolerated well in office. Next appointment due Dec 25-Jan 9.

## 2023-07-06 ENCOUNTER — Ambulatory Visit: Payer: Medicaid Other | Attending: Rheumatology | Admitting: *Deleted

## 2023-07-06 VITALS — BP 109/67 | HR 103

## 2023-07-06 DIAGNOSIS — M450 Ankylosing spondylitis of multiple sites in spine: Secondary | ICD-10-CM

## 2023-07-06 MED ORDER — CERTOLIZUMAB PEGOL 2 X 200 MG ~~LOC~~ KIT
400.0000 mg | PACK | Freq: Once | SUBCUTANEOUS | Status: AC
  Administered 2023-07-06: 400 mg via SUBCUTANEOUS

## 2023-07-06 NOTE — Progress Notes (Signed)
Subjective:   Patient presents to clinic today to receive monthly dose of Cimzia.  Patient running a fever or have signs/symptoms of infection? No  Patient currently on antibiotics for the treatment of infection? No  Patient have any upcoming invasive procedures/surgeries? No  Objective: CMP     Component Value Date/Time   NA 138 04/27/2023 1452   K 4.3 04/27/2023 1452   CL 107 04/27/2023 1452   CO2 19 (L) 04/27/2023 1452   GLUCOSE 80 04/27/2023 1452   BUN 12 04/27/2023 1452   CREATININE 0.74 04/27/2023 1452   CALCIUM 9.6 04/27/2023 1452   PROT 7.7 04/27/2023 1452   ALBUMIN 4.1 07/23/2020 0751   AST 31 (H) 04/27/2023 1452   ALT 39 (H) 04/27/2023 1452   ALKPHOS 47 07/23/2020 0751   BILITOT 0.6 04/27/2023 1452   GFRNONAA >60 07/23/2020 0751    CBC    Component Value Date/Time   WBC 6.2 04/27/2023 1452   RBC 4.29 04/27/2023 1452   HGB 12.6 04/27/2023 1452   HCT 37.3 04/27/2023 1452   PLT 214 04/27/2023 1452   MCV 86.9 04/27/2023 1452   MCH 29.4 04/27/2023 1452   MCHC 33.8 04/27/2023 1452   RDW 13.6 04/27/2023 1452   LYMPHSABS 2,015 04/27/2023 1452   EOSABS 112 04/27/2023 1452   BASOSABS 62 04/27/2023 1452    Baseline Immunosuppressant Therapy Labs TB GOLD    Latest Ref Rng & Units 11/09/2022    9:21 AM  Quantiferon TB Gold  Quantiferon TB Gold Plus NEGATIVE NEGATIVE    Hepatitis Panel    Latest Ref Rng & Units 11/03/2022    5:00 PM  Hepatitis  Hep B Surface Ag Negative Negative    HIV Lab Results  Component Value Date   HIV Non Reactive 11/03/2022   HIV NON-REACTIVE 04/30/2021   Immunoglobulins    Latest Ref Rng & Units 04/30/2021    9:45 AM  Immunoglobulin Electrophoresis  IgA  47 - 310 mg/dL 161   IgG 096 - 0,454 mg/dL 0,981   IgM 50 - 191 mg/dL 478    SPEP    Latest Ref Rng & Units 04/27/2023    2:52 PM  Serum Protein Electrophoresis  Total Protein 6.1 - 8.1 g/dL 7.7    G9FA No results found for: "G6PDH" TPMT No results found for: "TPMT"    Chest x-ray: 07/23/2020 Lungs clear. Cardiac silhouette normal.   Assessment/Plan:   Administrations This Visit     certolizumab pegol (CIMZIA) kit 400 mg     Admin Date 07/06/2023 Action Given Dose 400 mg Route Subcutaneous Documented By Henriette Combs, LPN             Patient tolerated injection well.   Appointment for next injection scheduled for 08/03/2023.  Patient due for labs in November 2024.  Patient is to call and reschedule appointment if running a fever with signs/symptoms of infection, on antibiotics for active infection or has an upcoming invasive procedure.  All questions encouraged and answered.  Instructed patient to call with any further questions or concerns.

## 2023-07-13 ENCOUNTER — Other Ambulatory Visit: Payer: Self-pay

## 2023-07-13 ENCOUNTER — Encounter: Payer: Self-pay | Admitting: Physician Assistant

## 2023-07-13 ENCOUNTER — Ambulatory Visit: Payer: Medicaid Other | Attending: Physician Assistant | Admitting: Physician Assistant

## 2023-07-13 VITALS — BP 108/77 | HR 91 | Resp 14 | Ht 66.0 in | Wt 142.6 lb

## 2023-07-13 DIAGNOSIS — H44113 Panuveitis, bilateral: Secondary | ICD-10-CM

## 2023-07-13 DIAGNOSIS — R7989 Other specified abnormal findings of blood chemistry: Secondary | ICD-10-CM

## 2023-07-13 DIAGNOSIS — Z79899 Other long term (current) drug therapy: Secondary | ICD-10-CM | POA: Diagnosis not present

## 2023-07-13 DIAGNOSIS — M533 Sacrococcygeal disorders, not elsewhere classified: Secondary | ICD-10-CM

## 2023-07-13 DIAGNOSIS — L401 Generalized pustular psoriasis: Secondary | ICD-10-CM

## 2023-07-13 DIAGNOSIS — G8929 Other chronic pain: Secondary | ICD-10-CM

## 2023-07-13 DIAGNOSIS — R519 Headache, unspecified: Secondary | ICD-10-CM

## 2023-07-13 DIAGNOSIS — M722 Plantar fascial fibromatosis: Secondary | ICD-10-CM

## 2023-07-13 DIAGNOSIS — R5383 Other fatigue: Secondary | ICD-10-CM

## 2023-07-13 DIAGNOSIS — M450 Ankylosing spondylitis of multiple sites in spine: Secondary | ICD-10-CM

## 2023-07-13 DIAGNOSIS — Z7952 Long term (current) use of systemic steroids: Secondary | ICD-10-CM

## 2023-07-13 DIAGNOSIS — M7918 Myalgia, other site: Secondary | ICD-10-CM

## 2023-07-13 MED ORDER — PREDNISONE 1 MG PO TABS: 4.0000 mg | ORAL_TABLET | Freq: Every day | ORAL | 0 refills | Status: DC

## 2023-07-13 NOTE — Telephone Encounter (Signed)
Please review and sign prednisone 1mg  rx. The prednisone 5mg  tablet rx has been discontinued. Thanks!

## 2023-07-13 NOTE — Patient Instructions (Signed)
Standing Labs We placed an order today for your standing lab work.   Please have your standing labs drawn in November and every 3 months   Please have your labs drawn 2 weeks prior to your appointment so that the provider can discuss your lab results at your appointment, if possible.  Please note that you may see your imaging and lab results in MyChart before we have reviewed them. We will contact you once all results are reviewed. Please allow our office up to 72 hours to thoroughly review all of the results before contacting the office for clarification of your results.  WALK-IN LAB HOURS  Monday through Thursday from 8:00 am -12:30 pm and 1:00 pm-5:00 pm and Friday from 8:00 am-12:00 pm.  Patients with office visits requiring labs will be seen before walk-in labs.  You may encounter longer than normal wait times. Please allow additional time. Wait times may be shorter on  Monday and Thursday afternoons.  We do not book appointments for walk-in labs. We appreciate your patience and understanding with our staff.   Labs are drawn by [facility]. Please bring your co-pay at the time of your lab draw.  You may receive a bill from [facility] for your lab work.  Please note if you are on Hydroxychloroquine and and an order has been placed for a Hydroxychloroquine level,  you will need to have it drawn 4 hours or more after your last dose.  If you wish to have your labs drawn at another location, please call the office 24 hours in advance so we can fax the orders.  The office is located at [address], [place], [place] [address]   If you have any questions regarding directions or hours of operation,  please call [phone].   As a reminder, please drink plenty of water prior to coming for your lab work. Thanks!

## 2023-07-17 ENCOUNTER — Telehealth: Payer: Self-pay | Admitting: Pharmacist

## 2023-07-17 DIAGNOSIS — Z79899 Other long term (current) drug therapy: Secondary | ICD-10-CM

## 2023-07-17 DIAGNOSIS — M450 Ankylosing spondylitis of multiple sites in spine: Secondary | ICD-10-CM

## 2023-07-17 NOTE — Telephone Encounter (Addendum)
I spoke with patient - she states she opted out of any medical benefits from new insurance and will be keeping Medicaid as primary  She is still family planning therefore Cimzia remains best treatment choice for her.  Dose: 400mg  SQ every 28 days  Chesley Mires, PharmD, MPH, BCPS, CPP Clinical Pharmacist (Rheumatology and Pulmonology)  ----- Message from Murrell Redden sent at 07/13/2023 12:47 PM EDT ----- Will need to confirm w pt if she'll have new insurance with new employer  Next Cimzia dose is due 08/03/2023 ----- Message ----- From: Ellen Henri, CMA Sent: 07/13/2023  12:09 PM EDT To: Rx Rheum/Pulm  Please apply for subcutaneous cimzia (at home) per Sherron Ales, PA-C. Thanks!

## 2023-07-17 NOTE — Telephone Encounter (Signed)
Pharmacy Patient Advocate Encounter   Per test claim: PA required; PA submitted to [employer] via CoverMyMeds Key/confirmation #/EOC Key: [id number] Status is pending

## 2023-07-18 ENCOUNTER — Ambulatory Visit (INDEPENDENT_AMBULATORY_CARE_PROVIDER_SITE_OTHER): Payer: Medicaid Other

## 2023-07-18 ENCOUNTER — Other Ambulatory Visit (HOSPITAL_COMMUNITY)
Admission: RE | Admit: 2023-07-18 | Discharge: 2023-07-18 | Disposition: A | Discharge status: Home / Self Care | Payer: Medicaid Other | Source: Ambulatory Visit

## 2023-07-18 ENCOUNTER — Other Ambulatory Visit (HOSPITAL_COMMUNITY): Payer: Self-pay

## 2023-07-18 VITALS — BP 111/76 | HR 86 | Wt 140.6 lb

## 2023-07-18 DIAGNOSIS — N898 Other specified noninflammatory disorders of vagina: Secondary | ICD-10-CM | POA: Diagnosis present

## 2023-07-18 DIAGNOSIS — B3731 Acute candidiasis of vulva and vagina: Secondary | ICD-10-CM | POA: Diagnosis not present

## 2023-07-18 MED ORDER — NYSTATIN-TRIAMCINOLONE 100000-0.1 UNIT/GM-% EX OINT: TOPICAL_OINTMENT | CUTANEOUS | 1 refills | Status: DC

## 2023-07-18 NOTE — Progress Notes (Signed)
   GYNECOLOGY PROBLEM OFFICE VISIT NOTE  History:  Chanell Heist is a 32 y.o. G1P0 here today for vaginal concerns. She states she has been having intermittent itching and irritation despite sexual abstinence for the past 6 months.  She also expresses concern with some "decolorization" of the vaginal area.  She does report she has viewed her vaginal area in the past.  She reports usage of consistent laundry detergents, but despite using the same brand of soap she does change the scents frequently.  Patient states her menstrual cycles are abnormal, but is satisfied with her depo provera.   Past Medical History:  Diagnosis Date   Anemia    Ankylosing spondylitis (HCC)    Arthritis     Past Surgical History:  Procedure Laterality Date   CESAREAN SECTION     GLAUCOMA SURGERY      The following portions of the patient's history were reviewed and updated as appropriate: allergies, current medications, past family history, past medical history, past social history, past surgical history and problem list.   Health Maintenance:  Normal pap and negative HRHPV on Feb 2024.  No mammogram on file d/t age.   Review of Systems:  Genito-Urinary ROS: no dysuria, trouble voiding, or hematuria Gastrointestinal ROS: negative Objective:  Vitals: BP 111/76   Pulse 86   Wt 140 lb 9.6 oz (63.8 kg)   BMI 22.69 kg/m   Physical Exam: Physical Exam Constitutional:      Appearance: Normal appearance.  Genitourinary:     Genitourinary Comments: NEFG Labial skin without apparent lesions or rashes.  Area above clitoral hood with white patch that is noted parallel to clitoris bilaterally. Introitus with obvious milky whitish gray discharge. No apparent irritation or malodor noted. CV collected blindly.   HENT:     Head: Normocephalic and atraumatic.  Eyes:     Conjunctiva/sclera: Conjunctivae normal.  Musculoskeletal:        General: Normal range of motion.     Cervical back: Normal range of  motion.  Neurological:     Mental Status: She is alert and oriented to person, place, and time.  Skin:    General: Skin is warm and dry.  Psychiatric:        Mood and Affect: Mood normal.        Behavior: Behavior normal.  Vitals and nursing note reviewed. Chaperone present: Levander Campion, RN.      Labs and Imaging: No results found.  Assessment & Plan:  32 year old Vaginal Irritation & itching Candidiasis of skin in vaginal area  -Exam performed. -Patient reassured that vagina appears normal with exception of notable discharge. -Further reassured that it is okay to have an occasional itch in the vaginal area as this does not always mean a problem is present! -Discussed changes in hygiene practices (using same soap consistently) to decrease incident of vaginal itching/irritation.  -Patient relieved with normal findings and receptive to education.  -Rx for Mycolog cream sent to pharmacy on file.  -Informed that if CV returns with findings requiring treatment will send to pharmacy on file. -Patient offered and declines medication for management of abnormal bleeding on depo provera.  -RTO prn or yearly for exam.    Total face-to-face time with patient: 15 minutes   Gerrit Heck, CNM 07/18/2023 4:15 PM

## 2023-07-18 NOTE — Telephone Encounter (Signed)
Received the notification from the plan that PA is not required, test claim still rejects for PA required. Will follow up with the plan:

## 2023-07-18 NOTE — Progress Notes (Signed)
Pt is in the office reporting vaginal irritation/itching and noticed some discoloration. Pt is currently on depo, reports irregular bleeding but pt does not want to change [facility]

## 2023-07-20 ENCOUNTER — Other Ambulatory Visit (HOSPITAL_COMMUNITY): Payer: Self-pay

## 2023-07-20 ENCOUNTER — Other Ambulatory Visit: Payer: Self-pay | Admitting: Pharmacist

## 2023-07-20 LAB — CERVICOVAGINAL ANCILLARY ONLY
Bacterial Vaginitis (gardnerella): POSITIVE — AB
Candida Glabrata: NEGATIVE
Candida Vaginitis: NEGATIVE
Chlamydia: NEGATIVE
Comment: NEGATIVE
Comment: NEGATIVE
Comment: NEGATIVE
Comment: NEGATIVE
Comment: NEGATIVE
Comment: NORMAL
Neisseria Gonorrhea: NEGATIVE
Trichomonas: NEGATIVE

## 2023-07-20 MED ORDER — CIMZIA (2 SYRINGE) 200 MG/ML ~~LOC~~ PSKT
400.0000 mg | PREFILLED_SYRINGE | SUBCUTANEOUS | 0 refills | Status: DC
  Filled 2023-07-31: qty 1, 28d supply, fill #0

## 2023-07-20 NOTE — Telephone Encounter (Addendum)
Called [employer] regarding Cimzia authorization that continues to reject. Per rep, because loading dose was never filled, the authorization only goes through for the starter kit right now. She states that the authorization has to be updated since patient already receiving loading in clinic through medical benefit. Rep updated and able to get paid test claim for $4 for 28 day supply. Authorization dates: 12/27/2022 - 11/25/2023 Authorization # [id number]  Next Cimzia dose is due 08/03/2023.  RepWard Givens Phone: [phone]  Rx sent to [facility] for onboarding for [facility]. Copay is $4.   Called patient to advise. Unable to reach. Left VM for patient - if she wants to complete first self-administration dose in clinic she can call and schedule for 08/03/2023  Chesley Mires, PharmD, MPH, BCPS, CPP Clinical Pharmacist (Rheumatology and Pulmonology)

## 2023-07-20 NOTE — Progress Notes (Signed)
Patient is transitioning from in-office Cimzia injections to self-administration. She is starting a new job and will be unable to come to clinic for injections.  Last Cimzia dose was on 07/06/2023.  Next dose for self-administration is due on 08/03/2023  Dose: Cimzia 400mg  as two divided injections every 28 days  Chesley Mires, PharmD, MPH, BCPS, CPP Clinical Pharmacist (Rheumatology and Pulmonology)

## 2023-07-25 ENCOUNTER — Other Ambulatory Visit: Payer: Self-pay

## 2023-07-25 DIAGNOSIS — B9689 Other specified bacterial agents as the cause of diseases classified elsewhere: Secondary | ICD-10-CM

## 2023-07-25 MED ORDER — METRONIDAZOLE 500 MG PO TABS: 500.0000 mg | ORAL_TABLET | Freq: Two times a day (BID) | ORAL | 0 refills | Status: DC

## 2023-07-26 ENCOUNTER — Other Ambulatory Visit: Payer: Self-pay

## 2023-07-31 ENCOUNTER — Other Ambulatory Visit: Payer: Self-pay

## 2023-07-31 NOTE — Progress Notes (Signed)
Specialty Pharmacy Initial Fill Coordination Note  Bethany Li is a 32 y.o. female contacted today regarding refills of specialty medication(s) Certolizumab Pegol   Patient requested Delivery   Delivery date: 08/02/23   Verified address: [address]   [place] [id number]   Medication will be filled on 08/01/23.   Patient is aware of $4.00 copayment. She is unable to provide any payment information at this time and would like copay to be billed to [facility] account. She has been advised that she can provide payment info at any time in the future.

## 2023-08-10 ENCOUNTER — Other Ambulatory Visit: Payer: Self-pay | Admitting: Physician Assistant

## 2023-08-10 NOTE — Telephone Encounter (Signed)
Last Fill: 07/13/2023  Next Visit: Due January 2025. Message sent to the front to schedule.   Last Visit: 07/13/2023  Dx: Ankylosing spondylitis of multiple sites in spine   Current Dose per office note on 07/13/2023: reducing prednisone to 4 mg daily and will be tapering by 1 mg every month if possible.   Patient states she is doing well on Prednisone 4 mg and is ready to taper to 3 mg.   Okay to refill Prednisone?

## 2023-08-17 ENCOUNTER — Other Ambulatory Visit: Payer: Self-pay

## 2023-08-23 ENCOUNTER — Other Ambulatory Visit: Payer: Self-pay

## 2023-08-28 ENCOUNTER — Other Ambulatory Visit: Payer: Self-pay

## 2023-08-28 ENCOUNTER — Other Ambulatory Visit: Payer: Self-pay | Admitting: Physician Assistant

## 2023-08-28 DIAGNOSIS — M450 Ankylosing spondylitis of multiple sites in spine: Secondary | ICD-10-CM

## 2023-08-28 DIAGNOSIS — Z79899 Other long term (current) drug therapy: Secondary | ICD-10-CM

## 2023-08-28 MED ORDER — CIMZIA (2 SYRINGE) 200 MG/ML ~~LOC~~ PSKT
400.0000 mg | PREFILLED_SYRINGE | SUBCUTANEOUS | 0 refills | Status: DC
  Filled 2023-08-28: qty 1, 28d supply, fill #0

## 2023-08-28 NOTE — Progress Notes (Signed)
Specialty Pharmacy Refill Coordination Note  Bethany Li is a 32 y.o. female contacted today regarding refills of specialty medication(s) Certolizumab Pegol   Patient requested Delivery   Delivery date: 08/31/23   Verified address: [address]   Medication will be filled on 08/30/23. Pending Refill Request.

## 2023-08-28 NOTE — Telephone Encounter (Signed)
Last Fill: 07/20/2023 (30 day supply)  Labs: 04/27/2023 CBC is normal.  CMP shows elevated liver functions.   TB Gold: 11/09/2022 Neg    Next Visit: Due January 2024. Message sent to the front to schedule.   Last Visit: 07/13/2023  DX: Ankylosing spondylitis of multiple sites in spine   Current Dose per office note 07/13/2023: Cimzia injections once monthly.   Patient advised she is due to update her labs. Patient states she will come to update her labs. Patient advised she will also need to to schedule a follow up appointment in January 2025.  Okay to refill Cimzia?

## 2023-08-29 ENCOUNTER — Other Ambulatory Visit: Payer: Self-pay | Admitting: *Deleted

## 2023-08-29 DIAGNOSIS — Z79899 Other long term (current) drug therapy: Secondary | ICD-10-CM

## 2023-08-30 ENCOUNTER — Other Ambulatory Visit: Payer: Self-pay

## 2023-08-30 LAB — COMPLETE METABOLIC PANEL WITH GFR
AG Ratio: 1.5 (calc) (ref 1.0–2.5)
ALT: 23 U/L (ref 6–29)
AST: 21 U/L (ref 10–30)
Albumin: 4.5 g/dL (ref 3.6–5.1)
Alkaline phosphatase (APISO): 41 U/L (ref 31–125)
BUN: 13 mg/dL (ref 7–25)
CO2: 22 mmol/L (ref 20–32)
Calcium: 9.3 mg/dL (ref 8.6–10.2)
Chloride: 108 mmol/L (ref 98–110)
Creat: 0.87 mg/dL (ref 0.50–0.97)
Globulin: 3 g/dL (ref 1.9–3.7)
Glucose, Bld: 98 mg/dL (ref 65–99)
Potassium: 4.1 mmol/L (ref 3.5–5.3)
Sodium: 139 mmol/L (ref 135–146)
Total Bilirubin: 0.6 mg/dL (ref 0.2–1.2)
Total Protein: 7.5 g/dL (ref 6.1–8.1)
eGFR: 91 mL/min/{1.73_m2} (ref >=60)

## 2023-08-30 LAB — CBC WITH DIFFERENTIAL/PLATELET
Absolute Lymphocytes: 2536 {cells}/uL (ref 850–3900)
Absolute Monocytes: 469 {cells}/uL (ref 200–950)
Basophils Absolute: 48 {cells}/uL (ref 0–200)
Basophils Relative: 0.7 %
Eosinophils Absolute: 177 {cells}/uL (ref 15–500)
Eosinophils Relative: 2.6 %
HCT: 39.1 % (ref 35.0–45.0)
Hemoglobin: 13 g/dL (ref 11.7–15.5)
MCH: 30.2 pg (ref 27.0–33.0)
MCHC: 33.2 g/dL (ref 32.0–36.0)
MCV: 90.7 fL (ref 80.0–100.0)
MPV: 11.1 fL (ref 7.5–12.5)
Monocytes Relative: 6.9 %
Neutro Abs: 3570 {cells}/uL (ref 1500–7800)
Neutrophils Relative %: 52.5 %
Platelets: 216 10*3/uL (ref 140–400)
RBC: 4.31 10*6/uL (ref 3.80–5.10)
RDW: 12.2 % (ref 11.0–15.0)
Total Lymphocyte: 37.3 %
WBC: 6.8 10*3/uL (ref 3.8–10.8)

## 2023-08-30 NOTE — Progress Notes (Signed)
CBC and CMP are normal.

## 2023-09-15 ENCOUNTER — Other Ambulatory Visit: Payer: Self-pay | Admitting: Rheumatology

## 2023-09-15 NOTE — Telephone Encounter (Signed)
Last Fill: 07/31/2023  Next Visit: 10/16/2023  Last Visit: 07/13/2023  Dx: Ankylosing spondylitis of multiple sites in spine   Current Dose per office note on 07/13/2023: She will take prednisone 4 mg daily x 1 month then reduce to 3 mg daily x 1 month, 2 mg daily x 1 month, 1 mg daily x 1 month and then will discontinue if able.   Okay to refill Prednisone?

## 2023-09-19 ENCOUNTER — Other Ambulatory Visit: Payer: Self-pay

## 2023-09-19 ENCOUNTER — Other Ambulatory Visit: Payer: Self-pay | Admitting: Physician Assistant

## 2023-09-19 DIAGNOSIS — Z79899 Other long term (current) drug therapy: Secondary | ICD-10-CM

## 2023-09-19 DIAGNOSIS — M450 Ankylosing spondylitis of multiple sites in spine: Secondary | ICD-10-CM

## 2023-09-19 NOTE — Progress Notes (Signed)
Specialty Pharmacy Refill Coordination Note  Bethany Li is a 32 y.o. female contacted today regarding refills of specialty medication(s) Certolizumab Pegol (Cimzia (2 Syringe))   Patient requested Delivery   Delivery date: 09/29/23   Verified address: [address]   Medication will be filled on 09/28/23.

## 2023-09-21 ENCOUNTER — Other Ambulatory Visit (HOSPITAL_COMMUNITY): Payer: Self-pay

## 2023-09-21 ENCOUNTER — Other Ambulatory Visit: Payer: Self-pay

## 2023-09-21 MED ORDER — CIMZIA (2 SYRINGE) 200 MG/ML ~~LOC~~ PSKT
400.0000 mg | PREFILLED_SYRINGE | SUBCUTANEOUS | 0 refills | Status: DC
  Filled 2023-09-21: qty 1, 28d supply, fill #0
  Filled 2023-10-27: qty 1, 28d supply, fill #1
  Filled 2023-11-28 – 2023-12-06 (×2): qty 1, 28d supply, fill #2

## 2023-09-21 NOTE — Telephone Encounter (Signed)
Last Fill: 08/28/2023  Labs: 08/29/2023 CBC and CMP are normal.   TB Gold: 11/09/2022  Neg    Next Visit: 10/16/2023  Last Visit: 07/13/2023  DX: Ankylosing spondylitis of multiple sites in spine   Current Dose per office note 07/13/2023: switching to at home sq Cimzia injections once monthly.   Okay to refill Cimzia?

## 2023-09-25 ENCOUNTER — Ambulatory Visit: Payer: Medicaid Other

## 2023-09-28 ENCOUNTER — Ambulatory Visit: Payer: Medicaid Other | Admitting: Dermatology

## 2023-09-28 ENCOUNTER — Other Ambulatory Visit: Payer: Self-pay

## 2023-10-02 NOTE — Progress Notes (Unsigned)
Office Visit Note  Patient: Bethany Li             Date of Birth: 15-Nov-1990           MRN: [id number]             PCP: Jackie Plum, MD Referring: Jackie Plum, MD Visit Date: 10/16/2023 Occupation: @GUAROCC @  Subjective:  Increased joint pain   History of Present Illness: Rhya Hackworth is a 33 y.o. female with history of ankylosing spondylitis and panuveitis. Patient remains on cimzia 400 mg sq injections every 28 days.  Patient remains on Cimzia without interruption.  She denies any side effects or injection site reactions.  Patient reports for the past few weeks she has been experiencing increased pain involving her right hand especially the right second MCP joint as well as some increased sacral pain.  Patient reports that she discontinued prednisone about 2 months ago.  She continues to use prednisolone eyedrops as recommended by her ophthalmologist.  Patient states that her last depo-provera injections was 3 months ago--she is currently planning pregnancy.    Activities of Daily Living:  Patient reports morning stiffness for   Patient Reports nocturnal pain.  Difficulty dressing/grooming: Denies Difficulty climbing stairs: Denies Difficulty getting out of chair: Denies Difficulty using hands for taps, buttons, cutlery, and/or writing: Reports  Review of Systems  Constitutional:  Negative for fatigue.  HENT:  Negative for mouth sores, mouth dryness and nose dryness.   Eyes:  Negative for pain and dryness.  Respiratory:  Negative for cough and shortness of breath.   Cardiovascular:  Negative for chest pain and palpitations.  Gastrointestinal:  Positive for constipation. Negative for blood in stool and diarrhea.  Endocrine: Negative for increased urination.  Genitourinary:  Negative for involuntary urination.  Musculoskeletal:  Positive for joint pain, joint pain, joint swelling, myalgias, muscle weakness, morning stiffness, muscle tenderness and  myalgias. Negative for gait problem.  Skin:  Negative for color change, rash, hair loss and sensitivity to sunlight.  Allergic/Immunologic: Positive for susceptible to infections.  Neurological:  Negative for dizziness and headaches.  Hematological:  Negative for swollen glands.  Psychiatric/Behavioral:  Negative for depressed mood and sleep disturbance. The patient is nervous/anxious.     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   Objective: Vital Signs: BP 106/72 (BP Location: Left Arm, Patient Position: Sitting, Cuff Size: Normal)   Pulse 88   Resp 14   Ht 5\' 6"  (1.676 m)   Wt 146 lb 6.4 oz (66.4 kg)   BMI 23.63 kg/m    Physical Exam Vitals and nursing note reviewed.  Constitutional:      Appearance: She is well-developed.  HENT:  Head: Normocephalic and atraumatic.  Eyes:     Conjunctiva/sclera: Conjunctivae normal.  Cardiovascular:     Rate and Rhythm: Normal rate and regular rhythm.     Heart sounds: Normal heart sounds.  Pulmonary:     Effort: Pulmonary effort is normal.     Breath sounds: Normal breath sounds.  Abdominal:     General: Bowel sounds are normal.     Palpations: Abdomen is soft.  Musculoskeletal:     Cervical back: Normal range of motion.  Lymphadenopathy:     Cervical: No cervical  adenopathy.  Skin:    General: Skin is warm and dry.     Capillary Refill: Capillary refill takes less than 2 seconds.  Neurological:     Mental Status: She is alert and oriented to person, place, and time.  Psychiatric:        Behavior: Behavior normal.      Musculoskeletal Exam: C-spine, thoracic spine, and lumbar spine good ROM.  No midline spinal tenderness.  Tenderness of both SI Joints.  Shoulder joints, elbow joints, wrist joints, MCPs, PIPs, DIPs have good range of motion with no synovitis.  Tenderness of the right second MCP joint.  Complete fist formation bilaterally.  Hip joints have good range of motion with no groin pain.  Knee joints have good range of motion no warmth or effusion.  Ankle joints have good range of motion with no tenderness or joint swelling.  CDAI Exam: CDAI Score: -- Patient Global: --; Provider Global: -- Swollen: --; Tender: -- Joint Exam 10/16/2023   No joint exam has been documented for this visit   There is currently no information documented on the homunculus. Go to the Rheumatology activity and complete the homunculus joint exam.  Investigation: No additional findings.  Imaging: No results found.  Recent Labs: Lab Results  Component Value Date   WBC 6.8 08/29/2023   HGB 13.0 08/29/2023   PLT 216 08/29/2023   NA 139 08/29/2023   K 4.1 08/29/2023   CL 108 08/29/2023   CO2 22 08/29/2023   GLUCOSE 98 08/29/2023   BUN 13 08/29/2023   CREATININE 0.87 08/29/2023   BILITOT 0.6 08/29/2023   ALKPHOS 47 07/23/2020   AST 21 08/29/2023   ALT 23 08/29/2023   PROT 7.5 08/29/2023   ALBUMIN 4.1 07/23/2020   CALCIUM 9.3 08/29/2023   QFTBGOLDPLUS NEGATIVE 11/09/2022    Speciality Comments: Diagnosed in [place].  Treated with Remicade, Humira and Simponi per patient.    Appointment every 3 months  Procedures:  No procedures performed Allergies: Patient has no known allergies.    Assessment / Plan:     Visit Diagnoses: Ankylosing  spondylitis of multiple sites in spine [facility]): Patient presents today with increased discomfort involving her lower back primarily both SI joints as well as her right hand.  Patient has tenderness over the right second MCP joint but no obvious synovitis was noted.  According to the patient she has noticed increased inflammation and stiffness in both feet intermittently.  Patient remains on Cimzia 400 mg sq injections every month.  She is tolerating Cimzia without any side effects or injection site reactions.  She has not had any recent or recurrent infections. Patient is currently planning pregnancy.  Her last Depo Provera injection was 3 months ago.  She is not a good candidate to resume methotrexate at this time.  Patient discontinued prednisone 2 months ago and has started to notice a recurrence of her arthralgias over the past 2  weeks.  A prednisone taper starting at 20 mg tapering by 5 mg every 4 days were sent to the pharmacy.  Patient was advised to notify us if she develops a recurrence of symptoms.  She will follow-up in the office in 5 months or sooner if needed.  High risk medication use - Started on in-office administered cimzia on 12/21/22. Switched to self administered Cimzia injections once monthly.  Discontinued taltz and Rasuvo. Considering pregnancy in the future-discontinued rasuvo. Last depo-provera injection administered 3 months ago--currently planning pregnancy so she is not a good candidate for methotrexate or arava.  She discontinued Taltz in January 2024 and Rasuvo in February 2024. CBC and CMP updated on 08/29/23.  Her next lab work will be due in March and every 3 months.   TB gold negative on 11/09/22-order for TB gold released today.  No recent or recurrent infections. Discussed the importance of holdin cimzia if she develops signs or symptoms of an infection and to resume once the infection has completely cleared. Plan: QuantiFERON-TB Gold Plus  Screening for tuberculosis -Order  for TB gold released today.  Plan: QuantiFERON-TB Gold Plus  Elevated LFTs: LFTs were within normal limits on 08/29/2023.  She will have updated lab work in March and then every 3 months.  Panuveitis of both eyes: She remains under the close follow-up with ophthalmology.  She is prescribed prednisolone eyedrops which she takes as prescribed.  Pustular psoriasis: Not currently symptomatic.  Long term (current) use of systemic steroids - She was able to taper off of long-term prednisone about 2 months ago. Over the past few weeks she has started to have a recurrence of arthralgias and joint stiffness involving multiple joints.  A prednisone taper starting at 20 mg tapering by 5 mg every 4 days were sent to the pharmacy today.  Instructions were provided.  Patient was advised to notify us if her symptoms persist or worsen despite completing the prednisone taper and remaining on Cimzia as prescribed.  Chronic SI joint pain: Patient presents today with increased discomfort involving both SI joints.  Prednisone taper starting at 20 mg tapering by 5 mg every 4 days was sent to the pharmacy.  Patient will remain on Cimzia as prescribed.  Patient plans on increasing her exercise regimen including yoga.  She will notify us if her symptoms recur despite the current treatment regimen.  Myofascial pain: Patient experiences intermittent myofascial pain. She plans on starting yoga.   Other fatigue: She is not currently experiencing increased fatigue.  Patient plans on increasing her exercise regimen.  Plantar fasciitis, bilateral: Patient has been experiencing increased pain and stiffness involving both feet.  Her symptoms seem consistent with plantar fasciitis.   Orders: Orders Placed This Encounter  Procedures   QuantiFERON-TB Gold Plus   Meds ordered this encounter  Medications   predniSONE (DELTASONE) 5 MG tablet    Sig: Take 4 tabs po x 4 days, 3  tabs po x 4 days, 2  tabs po x 4 days, 1  tab po x 4  days    Dispense:  40 tablet    Refill:  0    Follow-Up Instructions: Return in about 5 months (around 03/15/2024) for Ankylosing Spondylitis.   Gearldine Bienenstock, PA-C  Note - This record has been created using Dragon software.  Chart creation errors have been sought, but may not always  have been located. Such creation errors do not reflect on  the standard of medical care.

## 2023-10-16 ENCOUNTER — Encounter: Payer: Self-pay | Admitting: Physician Assistant

## 2023-10-16 ENCOUNTER — Ambulatory Visit: Payer: Medicaid Other | Attending: Physician Assistant | Admitting: Physician Assistant

## 2023-10-16 VITALS — BP 106/72 | HR 88 | Resp 14 | Ht 66.0 in | Wt 146.4 lb

## 2023-10-16 DIAGNOSIS — L401 Generalized pustular psoriasis: Secondary | ICD-10-CM

## 2023-10-16 DIAGNOSIS — R7989 Other specified abnormal findings of blood chemistry: Secondary | ICD-10-CM | POA: Diagnosis not present

## 2023-10-16 DIAGNOSIS — H44113 Panuveitis, bilateral: Secondary | ICD-10-CM | POA: Diagnosis not present

## 2023-10-16 DIAGNOSIS — Z7952 Long term (current) use of systemic steroids: Secondary | ICD-10-CM

## 2023-10-16 DIAGNOSIS — Z79899 Other long term (current) drug therapy: Secondary | ICD-10-CM

## 2023-10-16 DIAGNOSIS — M450 Ankylosing spondylitis of multiple sites in spine: Secondary | ICD-10-CM | POA: Diagnosis not present

## 2023-10-16 DIAGNOSIS — G8929 Other chronic pain: Secondary | ICD-10-CM

## 2023-10-16 DIAGNOSIS — R5383 Other fatigue: Secondary | ICD-10-CM

## 2023-10-16 DIAGNOSIS — Z111 Encounter for screening for respiratory tuberculosis: Secondary | ICD-10-CM

## 2023-10-16 DIAGNOSIS — M722 Plantar fascial fibromatosis: Secondary | ICD-10-CM

## 2023-10-16 DIAGNOSIS — M533 Sacrococcygeal disorders, not elsewhere classified: Secondary | ICD-10-CM

## 2023-10-16 DIAGNOSIS — M7918 Myalgia, other site: Secondary | ICD-10-CM

## 2023-10-16 MED ORDER — PREDNISONE 5 MG PO TABS: ORAL_TABLET | ORAL | 0 refills | Status: DC

## 2023-10-16 NOTE — Patient Instructions (Signed)

## 2023-10-17 NOTE — Addendum Note (Signed)
Addended by: Ellen Henri on: 10/17/2023 07:58 AM   Modules accepted: Orders

## 2023-10-19 ENCOUNTER — Other Ambulatory Visit: Payer: Self-pay

## 2023-10-19 LAB — QUANTIFERON-TB GOLD PLUS
Mitogen-NIL: >10 [IU]/mL
NIL: 0.04 [IU]/mL
QuantiFERON-TB Gold Plus: NEGATIVE
TB1-NIL: <0 [IU]/mL
TB2-NIL: 0 [IU]/mL

## 2023-10-19 NOTE — Progress Notes (Signed)
TB gold negative

## 2023-10-23 ENCOUNTER — Encounter (HOSPITAL_COMMUNITY): Payer: Self-pay

## 2023-10-23 ENCOUNTER — Other Ambulatory Visit (HOSPITAL_COMMUNITY): Payer: Self-pay

## 2023-10-25 ENCOUNTER — Other Ambulatory Visit (HOSPITAL_COMMUNITY): Payer: Self-pay

## 2023-10-27 ENCOUNTER — Other Ambulatory Visit: Payer: Self-pay

## 2023-10-27 NOTE — Progress Notes (Signed)
Specialty Pharmacy Refill Coordination Note  Bethany Li is a 34 y.o. female contacted today regarding refills of specialty medication(s) Certolizumab Pegol (Cimzia (2 Syringe))   Patient requested Delivery   Delivery date: 11/01/23   Verified address: Patient address [address]  [name] [address]   Medication will be filled on 02.04.25.

## 2023-10-31 ENCOUNTER — Other Ambulatory Visit: Payer: Self-pay

## 2023-11-07 ENCOUNTER — Encounter: Payer: Self-pay | Admitting: Dermatology

## 2023-11-07 ENCOUNTER — Ambulatory Visit (INDEPENDENT_AMBULATORY_CARE_PROVIDER_SITE_OTHER): Payer: Medicaid Other | Admitting: Dermatology

## 2023-11-07 DIAGNOSIS — L409 Psoriasis, unspecified: Secondary | ICD-10-CM | POA: Diagnosis not present

## 2023-11-07 DIAGNOSIS — L608 Other nail disorders: Secondary | ICD-10-CM

## 2023-11-07 DIAGNOSIS — L819 Disorder of pigmentation, unspecified: Secondary | ICD-10-CM

## 2023-11-07 DIAGNOSIS — Z79899 Other long term (current) drug therapy: Secondary | ICD-10-CM

## 2023-11-07 DIAGNOSIS — M450 Ankylosing spondylitis of multiple sites in spine: Secondary | ICD-10-CM

## 2023-11-07 DIAGNOSIS — M459 Ankylosing spondylitis of unspecified sites in spine: Secondary | ICD-10-CM

## 2023-11-07 DIAGNOSIS — L603 Nail dystrophy: Secondary | ICD-10-CM

## 2023-11-07 DIAGNOSIS — R21 Rash and other nonspecific skin eruption: Secondary | ICD-10-CM

## 2023-11-07 DIAGNOSIS — Z7189 Other specified counseling: Secondary | ICD-10-CM

## 2023-11-07 MED ORDER — MOMETASONE FUROATE 0.1 % EX CREA
1.0000 | TOPICAL_CREAM | Freq: Every day | CUTANEOUS | 1 refills | Status: AC | PRN
Start: 2023-11-07 — End: ?

## 2023-11-07 NOTE — Progress Notes (Signed)
New Patient Visit   Subjective  Bethany Li is a 33 y.o. female who presents for the following: L foot 3rd & 4th toe turning black started 2 years ago, pt's rheumatologist told her nail problem could be related to psoriasis. Pt reports hx ankylosing spondylitis currently on Cimzia now for 4-5 months, has done Humira in the past. Pt states she was switched from Humira to Cimzia after being diagnosed with psoriasis on feet and pt also wanting to conceive. Pt has not had any side effects on Cimzia.Psoriasis and Ankylosing Spondylitis doing well on Cimzia.  Pt also concerned for dry patches on chest noticed x3 weeks ago that started . Photo provided from patient's phone.  Pt referred from rheumatologist.   The patient has spots, moles and lesions to be evaluated, some may be new or changing and the patient may have concern these could be cancer.   The following portions of the chart were reviewed this encounter and updated as appropriate: medications, allergies, medical history  Review of Systems:  No other skin or systemic complaints except as noted in HPI or Assessment and Plan.  Objective  Well appearing patient in no apparent distress; mood and affect are within normal limits.   A focused examination was performed of the following areas: Feet, chest   Relevant exam findings are noted in the Assessment and Plan.       Assessment & Plan   Ankylosing Spondylitis and psoriasis Patient previously on Humira but switched over to Cimzia after being diagnosed with psoriasis on feet and wanting to conceive. Pt has not had any side effects on Cimzia.   Rash of Chest PSORIASIS vs Atopic Dermatitis - Chest Exam: Well-demarcated erythematous papules/plaques with silvery scale, guttate pink scaly papules at chest. 2% BSA.  Chronic and persistent condition with duration or expected duration over one year. Condition is bothersome/symptomatic for patient. Currently flared.    Psoriasis is a chronic non-curable, but treatable genetic/hereditary disease that may have other systemic features affecting other organ systems such as joints (Psoriatic Arthritis). It is associated with an increased risk of inflammatory bowel disease, heart disease, non-alcoholic fatty liver disease, and depression.  Treatments include light and laser treatments; topical medications; and systemic medications including oral and injectables.  Treatment Plan: Start Mometasone cream 1-2 times a day for up to 5 days a week to spot treat aa, is pregnancy safe. Avoid applying to face, groin, and axilla. Use as directed. Long-term use can cause thinning of the skin.   Topical steroids (such as triamcinolone, fluocinolone, fluocinonide, mometasone, clobetasol, halobetasol, betamethasone, hydrocortisone) can cause thinning and lightening of the skin if they are used for too long in the same area. Your physician has selected the right strength medicine for your problem and area affected on the body. Please use your medication only as directed by your physician to prevent side effects.    Recommend gentle skin care.    Nail Problem with dyschromia and dystrophy Psoriasis vs Fungal Infection vs Other at L 3rd and 4th toes And scale of 4-5 toes web space Exam: See photos provided from patient.  Treatment Plan: Will order PCR molecular testing today.on toenail and toe web space scrapings. Pt had acrylic nails applied to her toenails yesterday and therefore we cannot do nail clippings.  Advised if we do not get any definitive answers with molecular testing from the scrapings, we may have to have her remove her acrylic nails let them grow out and then do some clippings.  He understands.  Discussed if culture proves fungal infection, can add topical steroids. Patient advised she is already on good treatment for psoriasis.  PSORIASIS   RASH   NAIL DYSTROPHY    Return if symptoms worsen or fail to  improve.  Wynonia Lawman, CMA, am acting as scribe for Armida Sans, MD .   Documentation: I have reviewed the above documentation for accuracy and completeness, and I agree with the above.  Armida Sans, MD

## 2023-11-07 NOTE — Patient Instructions (Addendum)
Start Mometasone cream 1-2 times a day for up to 5 days a week to spot treat affected areas on chest, is pregnancy safe. Avoid applying to face, groin, and axilla. Use as directed. Long-term use can cause thinning of the skin.   Topical steroids (such as triamcinolone, fluocinolone, fluocinonide, mometasone, clobetasol, halobetasol, betamethasone, hydrocortisone) can cause thinning and lightening of the skin if they are used for too long in the same area. Your physician has selected the right strength medicine for your problem and area affected on the body. Please use your medication only as directed by your physician to prevent side effects.       Due to recent changes in healthcare laws, you may see results of your pathology and/or laboratory studies on MyChart before the doctors have had a chance to review them. We understand that in some cases there may be results that are confusing or concerning to you. Please understand that not all results are received at the same time and often the doctors may need to interpret multiple results in order to provide you with the best plan of care or course of treatment. Therefore, we ask that you please give [place] 2 business days to thoroughly review all your results before contacting the office for clarification. Should we see a critical lab result, you will be contacted sooner.   If You Need Anything After Your Visit  If you have any questions or concerns for your doctor, please call our main line at [phone] and press option 4 to reach your doctor's medical assistant. If no one answers, please leave a voicemail as directed and we will return your call as soon as possible. Messages left after 4 pm will be answered the following business day.   You may also send [place] a message via MyChart. We typically respond to MyChart messages within 1-2 business days.  For prescription refills, please ask your pharmacy to contact our office. Our fax number is  [phone].  If you have an urgent issue when the clinic is closed that cannot wait until the next business day, you can page your doctor at the number below.    Please note that while we do our best to be available for urgent issues outside of office hours, we are not available 24/7.   If you have an urgent issue and are unable to reach [place], you may choose to seek medical care at your doctor's office, retail clinic, urgent care center, or emergency room.  If you have a medical emergency, please immediately call 911 or go to the emergency department.  Pager Numbers  - Dr. Gwen Pounds: [phone]  - Dr. Roseanne Reno: [phone]  - Dr. Katrinka Blazing: [phone]   In the event of inclement weather, please call our main line at [phone] for an update on the status of any delays or closures.  Dermatology Medication Tips: Please keep the boxes that topical medications come in in order to help keep track of the instructions about where and how to use these. Pharmacies typically print the medication instructions only on the boxes and not directly on the medication tubes.   If your medication is too expensive, please contact our office at [phone] option 4 or send [place] a message through MyChart.   We are unable to tell what your co-pay for medications will be in advance as this is different depending on your insurance coverage. However, we may be able to find a substitute medication at lower cost or fill out paperwork to get  insurance to cover a needed medication.   If a prior authorization is required to get your medication covered by your insurance company, please allow [place] 1-2 business days to complete this process.  Drug prices often vary depending on where the prescription is filled and some pharmacies may offer cheaper prices.  The website www.goodrx.com contains coupons for medications through different pharmacies. The prices here do not account for what the cost may be with help from  insurance (it may be cheaper with your insurance), but the website can give you the price if you did not use any insurance.  - You can print the associated coupon and take it with your prescription to the pharmacy.  - You may also stop by our office during regular business hours and pick up a GoodRx coupon card.  - If you need your prescription sent electronically to a different pharmacy, notify our office through [facility] or by phone at [phone] option 4.     Si Usted Necesita Algo Despus de Su Visita  Tambin puede enviarnos un mensaje a travs de Clinical cytogeneticist. Por lo general respondemos a los mensajes de MyChart en el transcurso de 1 a 2 das hbiles.  Para renovar recetas, por favor pida a su farmacia que se ponga en contacto con nuestra oficina. Annie Sable de fax es [place] [phone].  Si tiene un asunto urgente cuando la clnica est cerrada y que no puede esperar hasta el siguiente da hbil, puede llamar/localizar a su doctor(a) al nmero que aparece a continuacin.   Por favor, tenga en cuenta que aunque hacemos todo lo posible para estar disponibles para asuntos urgentes fuera del horario de [place], no estamos disponibles las 24 horas del da, los 7 [address]  [address] de la [place].   Si tiene un problema urgente y no puede comunicarse con nosotros, puede optar por buscar atencin mdica  en el consultorio de su doctor(a), en una clnica privada, en un centro de atencin urgente o en una sala de emergencias.  Si tiene Engineer, drilling, por favor llame inmediatamente al 911 o vaya a la sala de emergencias.  Nmeros de bper  - Dr. Gwen Pounds: [phone]  - Dra. Roseanne Reno: [phone]  - Dr. Katrinka Blazing: [phone]   En caso de inclemencias del tiempo, por favor llame a Lacy Duverney principal al [phone] para una actualizacin sobre el [place] de cualquier retraso o cierre.  Consejos para la medicacin en dermatologa: Por favor, guarde las cajas en las que vienen los  medicamentos de uso tpico para ayudarle a seguir las instrucciones sobre dnde y cmo usarlos. Las farmacias generalmente imprimen las instrucciones del medicamento slo en las cajas y no directamente en los tubos del [place].   Si su medicamento es muy caro, por favor, pngase en contacto con Rolm Gala llamando al [phone] y presione la opcin 4 o envenos un mensaje a travs de Clinical cytogeneticist.   No podemos decirle cul ser su copago por los medicamentos por adelantado ya que esto es diferente dependiendo de la cobertura de su seguro. Sin embargo, es posible que podamos encontrar un medicamento sustituto a Audiological scientist un formulario para que el seguro cubra el medicamento que se considera necesario.   Si se requiere una autorizacin previa para que su compaa de seguros [place] su medicamento, por favor permtanos de 1 a 2 das hbiles para completar [address]  Los precios de los medicamentos varan con frecuencia dependiendo del Environmental consultant de dnde se surte la receta y Careers adviser pueden  ofrecer precios ms baratos.  El sitio web www.goodrx.com tiene cupones para medicamentos de Health and safety inspector. Los precios aqu no tienen en cuenta lo que podra costar con la ayuda del seguro (puede ser ms barato con su seguro), pero el sitio web puede darle el precio si no utiliz Tourist information centre manager.  - Puede imprimir el cupn correspondiente y llevarlo con su receta a la farmacia.  - Tambin puede pasar por nuestra oficina durante el horario de atencin regular y Education officer, museum una tarjeta de cupones de GoodRx.  - Si necesita que su receta se enve electrnicamente a una farmacia diferente, informe a nuestra oficina a travs de MyChart de [place] o por telfono llamando al [phone] y presione la opcin 4.

## 2023-11-08 ENCOUNTER — Ambulatory Visit: Payer: Medicaid Other | Admitting: Dermatology

## 2023-11-09 ENCOUNTER — Telehealth: Payer: Self-pay

## 2023-11-09 NOTE — Telephone Encounter (Signed)
Bethany Li has left voicemail for pt to return call. aw

## 2023-11-09 NOTE — Telephone Encounter (Signed)
LM on VM please call here to discuss fungal culture results and treatment plan.

## 2023-11-09 NOTE — Telephone Encounter (Signed)
Nail fungal ID is back and in patient's media. aw

## 2023-11-13 MED ORDER — TERBINAFINE HCL 250 MG PO TABS: 250.0000 mg | ORAL_TABLET | Freq: Every day | ORAL | 0 refills | Status: DC

## 2023-11-13 NOTE — Telephone Encounter (Signed)
 RX sent in and follow up appt scheduled.  Patient advised of information per Dr. Gwen Pounds. aw

## 2023-11-13 NOTE — Addendum Note (Signed)
 Addended by: Dorathy Daft R on: 11/13/2023 04:16 PM   Modules accepted: Orders

## 2023-11-14 ENCOUNTER — Ambulatory Visit: Payer: Medicaid Other | Admitting: Dermatology

## 2023-11-15 ENCOUNTER — Other Ambulatory Visit: Payer: Self-pay | Admitting: Rheumatology

## 2023-11-22 ENCOUNTER — Other Ambulatory Visit (HOSPITAL_COMMUNITY): Payer: Self-pay

## 2023-11-24 ENCOUNTER — Other Ambulatory Visit (HOSPITAL_COMMUNITY): Payer: Self-pay

## 2023-11-24 ENCOUNTER — Other Ambulatory Visit: Payer: Self-pay

## 2023-11-24 NOTE — Progress Notes (Addendum)
 Specialty Pharmacy Refill Coordination Note  Bethany Li is a 33 y.o. female contacted today regarding refills of specialty medication(s) Certolizumab Pegol (Cimzia (2 Syringe))   Patient requested (Patient-Rptd) Delivery   Delivery date: 11/28/23  Verified address: (Patient-Rptd) [address]   Medication will be filled on 11/27/23.     Medication requires PA. Pa was denied, and [place] Koller is appealing.

## 2023-11-28 ENCOUNTER — Other Ambulatory Visit (HOSPITAL_COMMUNITY): Payer: Self-pay

## 2023-11-28 ENCOUNTER — Other Ambulatory Visit: Payer: Self-pay

## 2023-11-28 ENCOUNTER — Telehealth: Payer: Self-pay

## 2023-11-28 NOTE — Telephone Encounter (Signed)
 Received notification from [facility] pharmacy that patient requires a new authorization for their medication.  Submitted an URGENT Prior Authorization request to [facility] for [facility] via CoverMyMeds. Will update once we receive a response.  Key: [id number]

## 2023-11-29 ENCOUNTER — Other Ambulatory Visit: Payer: Self-pay

## 2023-11-29 ENCOUNTER — Other Ambulatory Visit (HOSPITAL_COMMUNITY): Payer: Self-pay

## 2023-11-29 NOTE — Telephone Encounter (Signed)
 Received a fax regarding Prior Authorization from [facility] for CIMZIA. Authorization has been DENIED because: .  Denial letter (with appeal form attached) has been sent to scan center. Patient has negative Hep B test as of 11/03/22.

## 2023-11-30 ENCOUNTER — Other Ambulatory Visit: Payer: Self-pay

## 2023-11-30 ENCOUNTER — Other Ambulatory Visit (HOSPITAL_COMMUNITY): Payer: Self-pay

## 2023-12-01 ENCOUNTER — Other Ambulatory Visit: Payer: Self-pay

## 2023-12-01 ENCOUNTER — Other Ambulatory Visit: Payer: Self-pay | Admitting: Rheumatology

## 2023-12-04 ENCOUNTER — Other Ambulatory Visit: Payer: Self-pay

## 2023-12-04 NOTE — Telephone Encounter (Signed)
 Submitted an URGENT appeal to [employer] for [facility].  Reference # [id number] Fax: [phone] Phone: [phone]  Chesley Mires, PharmD, MPH, BCPS, CPP Clinical Pharmacist (Rheumatology and Pulmonology)

## 2023-12-04 NOTE — Telephone Encounter (Signed)
 Patient contacted the office and states she was supposed to take her Cimzia on 12/01/2023. Patient states she has not had her Cimzia this month. Patient states the pharmacy for her medication had called her on 11/29/2023 and she had not answered. Patient states she contacted them back and was then advised her Cimzia was not approved. Patient states she does not know what to do. Patient states she would like a call back. Please advise.

## 2023-12-04 NOTE — Telephone Encounter (Signed)
 Ok to provide sample to pt while Cimzia appeal is processed

## 2023-12-04 NOTE — Progress Notes (Signed)
 Rx profiled; [place] [facility] has filed an appeal.

## 2023-12-06 ENCOUNTER — Other Ambulatory Visit (HOSPITAL_COMMUNITY): Payer: Self-pay

## 2023-12-06 ENCOUNTER — Other Ambulatory Visit: Payer: Self-pay

## 2023-12-06 NOTE — Progress Notes (Signed)
 PA approved - medication will ship 3.13.25. patient is aware of updated delivery date.

## 2023-12-06 NOTE — Telephone Encounter (Addendum)
 Test claim for Cimzia successfully processed for $4. Have not yet received determination letter however have notified Shavod at specialty pharmacy regarding approval so claim can be reprocessed.  Chesley Mires, PharmD, MPH, BCPS, CPP Clinical Pharmacist (Rheumatology and Pulmonology)

## 2023-12-12 NOTE — Telephone Encounter (Signed)
 Approval letter for Cimzia PFS received from [facility]. It is approved from 12/05/2023 to 12/04/2024  Code [id number] PA# [id number]  Chesley Mires, PharmD, MPH, BCPS, CPP Clinical Pharmacist (Rheumatology and Pulmonology)

## 2023-12-19 ENCOUNTER — Other Ambulatory Visit (HOSPITAL_COMMUNITY): Payer: Self-pay

## 2023-12-26 ENCOUNTER — Other Ambulatory Visit (HOSPITAL_COMMUNITY): Payer: Self-pay

## 2023-12-28 ENCOUNTER — Other Ambulatory Visit (HOSPITAL_COMMUNITY): Payer: Self-pay

## 2023-12-28 ENCOUNTER — Other Ambulatory Visit: Payer: Self-pay

## 2023-12-28 ENCOUNTER — Other Ambulatory Visit: Payer: Self-pay | Admitting: Physician Assistant

## 2023-12-28 DIAGNOSIS — M450 Ankylosing spondylitis of multiple sites in spine: Secondary | ICD-10-CM

## 2023-12-28 DIAGNOSIS — Z79899 Other long term (current) drug therapy: Secondary | ICD-10-CM

## 2023-12-28 MED ORDER — CIMZIA (2 SYRINGE) 200 MG/ML ~~LOC~~ PSKT
400.0000 mg | PREFILLED_SYRINGE | SUBCUTANEOUS | 0 refills | Status: DC
  Filled 2023-12-28: qty 1, 28d supply, fill #0
  Filled 2024-01-29: qty 1, 28d supply, fill #1
  Filled 2024-03-04 – 2024-03-05 (×2): qty 1, 28d supply, fill #2

## 2023-12-28 NOTE — Telephone Encounter (Signed)
 Last Fill: 09/21/2023  Labs: 08/29/2023 CBC and CMP are normal.   TB Gold: 10/16/2023   Next Visit: Due June 2025. Message sent to the front to schedule.   Last Visit: 10/16/2023  DX:Ankylosing spondylitis of multiple sites in spine   Current Dose per office note 10/16/2023:  Cimzia 400 mg sq injections every month.   Okay to refill Cimzia?

## 2023-12-28 NOTE — Progress Notes (Signed)
 Specialty Pharmacy Refill Coordination Note  Bethany Li is a 33 y.o. female contacted today regarding refills of specialty medication(s) Certolizumab Pegol (Cimzia (2 Syringe))   Patient requested Delivery   Delivery date: 01/03/24   Verified address: [address]  [place]  [address]   Medication will be filled on 01/02/24. This fill date is pending response to refill request from provider. Patient is aware and if they have not received fill by intended date they must follow up with pharmacy.

## 2023-12-28 NOTE — Progress Notes (Signed)
 Specialty Pharmacy Ongoing Clinical Assessment Note  Bethany Li is a 33 y.o. female who is being followed by the specialty pharmacy service for RxSp Ankylosing Spondylitis   Patient's specialty medication(s) reviewed today: Certolizumab Pegol (Cimzia (2 Syringe))   Missed doses in the last 4 weeks: 0   Patient/Caregiver did not have any additional questions or concerns.   Therapeutic benefit summary: Patient is achieving benefit   Adverse events/side effects summary: No adverse events/side effects   Patient's therapy is appropriate to: Continue    Goals Addressed             This Visit's Progress    Maintain optimal adherence to therapy   On track    Patient is on track. Patient will maintain adherence and adhere to provider and/or lab appointments         Follow up:  6 months  Servando Snare Specialty Pharmacist

## 2023-12-28 NOTE — Telephone Encounter (Signed)
 Please schedule patient a follow up visit. Patient due June 2025. Thanks!   Follow-Up Instructions: Return in about 5 months (around 03/15/2024) for Ankylosing Spondylitis.

## 2023-12-29 ENCOUNTER — Other Ambulatory Visit: Payer: Self-pay

## 2023-12-29 ENCOUNTER — Other Ambulatory Visit: Payer: Self-pay | Admitting: *Deleted

## 2023-12-29 DIAGNOSIS — Z79899 Other long term (current) drug therapy: Secondary | ICD-10-CM

## 2023-12-30 LAB — CBC WITH DIFFERENTIAL/PLATELET
Absolute Lymphocytes: 1646 {cells}/uL (ref 850–3900)
Absolute Monocytes: 578 {cells}/uL (ref 200–950)
Basophils Absolute: 39 {cells}/uL (ref 0–200)
Basophils Relative: 0.8 %
Eosinophils Absolute: 127 {cells}/uL (ref 15–500)
Eosinophils Relative: 2.6 %
HCT: 36 % (ref 35.0–45.0)
Hemoglobin: 11.7 g/dL (ref 11.7–15.5)
MCH: 28.5 pg (ref 27.0–33.0)
MCHC: 32.5 g/dL (ref 32.0–36.0)
MCV: 87.8 fL (ref 80.0–100.0)
MPV: 11.4 fL (ref 7.5–12.5)
Monocytes Relative: 11.8 %
Neutro Abs: 2509 {cells}/uL (ref 1500–7800)
Neutrophils Relative %: 51.2 %
Platelets: 206 10*3/uL (ref 140–400)
RBC: 4.1 10*6/uL (ref 3.80–5.10)
RDW: 11.6 % (ref 11.0–15.0)
Total Lymphocyte: 33.6 %
WBC: 4.9 10*3/uL (ref 3.8–10.8)

## 2023-12-30 LAB — COMPREHENSIVE METABOLIC PANEL WITH GFR
AG Ratio: 1.6 (calc) (ref 1.0–2.5)
ALT: 13 U/L (ref 6–29)
AST: 19 U/L (ref 10–30)
Albumin: 4.4 g/dL (ref 3.6–5.1)
Alkaline phosphatase (APISO): 41 U/L (ref 31–125)
BUN: 9 mg/dL (ref 7–25)
CO2: 27 mmol/L (ref 20–32)
Calcium: 9.3 mg/dL (ref 8.6–10.2)
Chloride: 108 mmol/L (ref 98–110)
Creat: 0.76 mg/dL (ref 0.50–0.97)
Globulin: 2.7 g/dL (ref 1.9–3.7)
Glucose, Bld: 85 mg/dL (ref 65–99)
Potassium: 4.9 mmol/L (ref 3.5–5.3)
Sodium: 139 mmol/L (ref 135–146)
Total Bilirubin: 0.6 mg/dL (ref 0.2–1.2)
Total Protein: 7.1 g/dL (ref 6.1–8.1)
eGFR: 107 mL/min/{1.73_m2} (ref >=60)

## 2023-12-31 NOTE — Progress Notes (Signed)
 CBC and CMP are normal.

## 2024-01-01 ENCOUNTER — Other Ambulatory Visit (HOSPITAL_COMMUNITY): Payer: Self-pay

## 2024-01-02 ENCOUNTER — Other Ambulatory Visit: Payer: Self-pay

## 2024-01-03 ENCOUNTER — Other Ambulatory Visit (HOSPITAL_COMMUNITY): Payer: Self-pay

## 2024-01-03 NOTE — Progress Notes (Signed)
 Submitted an URGENT Prior Authorization request to [facility] for [facility] via CoverMyMeds. Will update once we receive a response.  Key: Lacie Draft

## 2024-01-04 ENCOUNTER — Other Ambulatory Visit: Payer: Self-pay

## 2024-01-04 ENCOUNTER — Telehealth: Payer: Self-pay | Admitting: Pharmacist

## 2024-01-04 NOTE — Telephone Encounter (Signed)
 Submitted an URGENT appeal to [employer] for [facility].  Reference # [id number] Phone: [phone] Fax: [phone]

## 2024-01-04 NOTE — Progress Notes (Signed)
 Appeal submitted

## 2024-01-05 ENCOUNTER — Other Ambulatory Visit (HOSPITAL_COMMUNITY): Payer: Self-pay

## 2024-01-05 ENCOUNTER — Other Ambulatory Visit: Payer: Self-pay

## 2024-01-05 NOTE — Telephone Encounter (Signed)
 Received fax from [employer]. Cimzia is APPROVED from 01/04/2024 to 01/02/2025

## 2024-01-05 NOTE — Progress Notes (Signed)
 Cimzia when processing still not going thru rejecting

## 2024-01-08 ENCOUNTER — Other Ambulatory Visit (HOSPITAL_COMMUNITY): Payer: Self-pay

## 2024-01-08 ENCOUNTER — Other Ambulatory Visit: Payer: Self-pay

## 2024-01-08 NOTE — Progress Notes (Signed)
 Cimzia billed on 01/08/2024 by pharmacy. NFN

## 2024-01-23 ENCOUNTER — Other Ambulatory Visit: Payer: Self-pay

## 2024-01-25 ENCOUNTER — Other Ambulatory Visit: Payer: Self-pay

## 2024-01-29 ENCOUNTER — Other Ambulatory Visit: Payer: Self-pay

## 2024-01-29 NOTE — Progress Notes (Signed)
 Specialty Pharmacy Refill Coordination Note  Bethany Li is a 33 y.o. female contacted today regarding refills of specialty medication(s) Certolizumab Pegol  (Cimzia  (2 Syringe))   Patient requested Delivery   Delivery date: 02/02/24   Verified address: [address], [address]  [place] [address]   Medication will be filled on 05.08.25.

## 2024-02-01 ENCOUNTER — Other Ambulatory Visit: Payer: Self-pay

## 2024-03-01 ENCOUNTER — Other Ambulatory Visit: Payer: Self-pay

## 2024-03-04 ENCOUNTER — Other Ambulatory Visit: Payer: Self-pay

## 2024-03-05 ENCOUNTER — Other Ambulatory Visit: Payer: Self-pay

## 2024-03-05 NOTE — Progress Notes (Deleted)
 Office Visit Note  Patient: Bethany Li             Date of Birth: 01/09/1991           MRN: [id number]             PCP: Catalina Bare, MD Referring: Catalina Bare, MD Visit Date: 03/19/2024 Occupation: @GUAROCC @  Subjective:    History of Present Illness: Bethany Li is a 33 y.o. female with history of ankylosing spondylitis and panuveitis.  Patient remains on  Cimzia  injections once monthly.   TB gold negative on 10/16/23.  CBC and CMP updated on 12/29/23. Orders for CBC and CMP released today.  Discussed the importance of holding cimzia  if she develops signs or symptoms of an infection and to resume once the infection has completely cleared.    Activities of Daily Living:  Patient reports morning stiffness for *** {minute/hour:19697}.   Patient {ACTIONS;DENIES/REPORTS:21021675::Denies} nocturnal pain.  Difficulty dressing/grooming: {ACTIONS;DENIES/REPORTS:21021675::Denies} Difficulty climbing stairs: {ACTIONS;DENIES/REPORTS:21021675::Denies} Difficulty getting out of chair: {ACTIONS;DENIES/REPORTS:21021675::Denies} Difficulty using hands for taps, buttons, cutlery, and/or writing: {ACTIONS;DENIES/REPORTS:21021675::Denies}  No Rheumatology ROS completed.   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   Objective: Vital Signs: There were no vitals taken for this visit.   Physical Exam Vitals and nursing note reviewed.  Constitutional:      Appearance: She is well-developed.  HENT:     Head: Normocephalic and atraumatic.   Eyes:     Conjunctiva/sclera: Conjunctivae normal.    Cardiovascular:     Rate and Rhythm: Normal rate and regular rhythm.     Heart sounds: Normal heart sounds.  Pulmonary:     Effort: Pulmonary effort is normal.     Breath sounds: Normal breath sounds.  Abdominal:     General: Bowel sounds are normal.     Palpations: Abdomen is soft.   Musculoskeletal:     Cervical back: Normal range of motion.  Lymphadenopathy:     Cervical: No cervical adenopathy.   Skin:    General: Skin is warm and dry.     Capillary Refill: Capillary refill takes less than 2 seconds.   Neurological:     Mental Status: She is alert and oriented to person, place, and time.   Psychiatric:        Behavior: Behavior normal.      Musculoskeletal Exam: ***  CDAI Exam: CDAI Score: -- Patient Global: --; Provider Global: -- Swollen: --; Tender: -- Joint Exam 03/19/2024   No joint exam has been documented for this visit   There is currently no information documented on the homunculus. Go to the Rheumatology activity and complete the homunculus joint exam.  Investigation: No additional findings.  Imaging: No results found.  Recent Labs: Lab Results  Component Value Date   WBC 4.9 12/29/2023   HGB 11.7 12/29/2023   PLT 206 12/29/2023   NA 139 12/29/2023   K 4.9 12/29/2023   CL 108 12/29/2023   CO2 27 12/29/2023   GLUCOSE 85 12/29/2023   BUN 9 12/29/2023   CREATININE 0.76 12/29/2023   BILITOT 0.6 12/29/2023   ALKPHOS 47 07/23/2020   AST 19 12/29/2023   ALT 13 12/29/2023   PROT 7.1 12/29/2023   ALBUMIN 4.1 07/23/2020   CALCIUM 9.3 12/29/2023   QFTBGOLDPLUS NEGATIVE 10/16/2023    Speciality Comments: Diagnosed in [place].  Treated with  Remicade, Humira and Simponi per patient.    Appointment every 3 months  Procedures:  No procedures performed Allergies: Patient has no known allergies.   Assessment / Plan:     Visit Diagnoses: Ankylosing spondylitis of multiple sites in spine (HCC)  High risk medication use  Elevated LFTs  Panuveitis of both eyes  Pustular psoriasis  Long term (current) use of systemic steroids  Chronic SI joint pain  Myofascial pain  Other fatigue  Chronic nonintractable headache, unspecified headache type  Orders: No orders of the defined types were placed in this encounter.  No orders of the defined types were placed in this encounter.   Face-to-face time spent with patient was *** minutes. Greater than 50% of time was spent in counseling and coordination of care.  Follow-Up Instructions: No follow-ups on file.   Waddell CHRISTELLA Craze, PA-C  Note - This record has been created using Dragon software.  Chart creation errors have been sought, but may not always  have been located. Such creation errors do not reflect on  the standard of medical care.

## 2024-03-06 ENCOUNTER — Other Ambulatory Visit: Payer: Self-pay

## 2024-03-06 NOTE — Progress Notes (Addendum)
 Specialty Pharmacy Refill Coordination Note  Bethany Li is a 33 y.o. female contacted today regarding refills of specialty medication(s) Certolizumab Pegol  (Cimzia  (2 Syringe))   Patient requested Delivery   Delivery date: 03/07/24   Verified address: [address], [address]  [place] [address]   Medication will be filled on 03/06/24.     03/20/24: Patient called and said she never received package. UPS tracking says package was delivered 03/07/24 at 9:15 AM. Left voice mail for patient to confirm that she has checked with complex office/mailroom and any package drop boxes the complex may have.Tracking number for 03/06/24 shipment: [id number].

## 2024-03-19 ENCOUNTER — Ambulatory Visit: Admitting: Physician Assistant

## 2024-03-19 DIAGNOSIS — M7918 Myalgia, other site: Secondary | ICD-10-CM

## 2024-03-19 DIAGNOSIS — Z7952 Long term (current) use of systemic steroids: Secondary | ICD-10-CM

## 2024-03-19 DIAGNOSIS — Z79899 Other long term (current) drug therapy: Secondary | ICD-10-CM

## 2024-03-19 DIAGNOSIS — H44113 Panuveitis, bilateral: Secondary | ICD-10-CM

## 2024-03-19 DIAGNOSIS — G8929 Other chronic pain: Secondary | ICD-10-CM

## 2024-03-19 DIAGNOSIS — M450 Ankylosing spondylitis of multiple sites in spine: Secondary | ICD-10-CM

## 2024-03-19 DIAGNOSIS — R7989 Other specified abnormal findings of blood chemistry: Secondary | ICD-10-CM

## 2024-03-19 DIAGNOSIS — R5383 Other fatigue: Secondary | ICD-10-CM

## 2024-03-19 DIAGNOSIS — L401 Generalized pustular psoriasis: Secondary | ICD-10-CM

## 2024-03-19 NOTE — Progress Notes (Unsigned)
 Office Visit Note  Patient: Bethany Li             Date of Birth: [birth year]           MRN: [id number]             PCP: Catalina Bare, MD Referring: Catalina Bare, MD Visit Date: 03/20/2024 Occupation: @GUAROCC @  Subjective:  Medication monitoring  History of Present Illness: Bethany Li is a 33 y.o. female with history of ankylosing spondylitis and panuveitis.  Patient remains on  Cimzia  injections once monthly.  Patient states that she was due for her Cimzia  injection on 03/12/2024 but has not yet received the shipment from the pharmacy.  Patient states that stress continues to be the main trigger for flares but overall she has continued to find since able to be effective at managing her symptoms.  Patient has upcoming appointment with ophthalmology scheduled. Patient has noted some increased swelling in both feet but denies any pain at this time.  She denies any Achilles tendinitis or plantar fasciitis.  She continues to have ongoing SI joint pain but it has been manageable.  She has been stretching her lower back first thing in the mornings.  Patient has noticed some difficulty with opening jars and issues with grip strength.  She denies any swelling in her hands at this time.  She denies taking any over-the-counter products for symptomatic relief.  She denies any psoriasis at this time. Patient states that she will be restarting Depo-Provera  injections on 03/27/2024.    Activities of Daily Living:  Patient reports morning stiffness for 10 minutes.   Patient Reports nocturnal pain.  Difficulty dressing/grooming: Denies Difficulty climbing stairs: Denies Difficulty getting out of chair: Denies Difficulty using hands for taps, buttons, cutlery, and/or writing: Reports  Review of Systems  Constitutional: Negative.  Negative for fatigue.  HENT: Negative.  Negative for mouth sores and mouth dryness.   Eyes: Negative.  Negative for dryness.  Respiratory: Negative.   Negative for shortness of breath.   Cardiovascular: Negative.  Negative for chest pain and palpitations.  Gastrointestinal: Negative.  Negative for blood in stool, constipation and diarrhea.  Endocrine: Negative.  Negative for increased urination.  Genitourinary: Negative.  Negative for involuntary urination.  Musculoskeletal:  Positive for joint pain, joint pain, joint swelling, morning stiffness and muscle tenderness. Negative for gait problem, myalgias, muscle weakness and myalgias.  Skin: Negative.  Negative for color change, rash, hair loss and sensitivity to sunlight.  Allergic/Immunologic: Negative.  Negative for susceptible to infections.  Neurological: Negative.  Negative for dizziness and headaches.  Hematological: Negative.  Negative for swollen glands.  Psychiatric/Behavioral: Negative.  Negative for depressed mood and sleep disturbance. The patient is not nervous/anxious.     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    Past Medical History:  Diagnosis Date   Anemia    Ankylosing spondylitis (HCC)    Arthritis     Family History  Problem Relation Age of Onset   Rheum arthritis Father    Past Surgical History:  Procedure Laterality Date   CESAREAN SECTION     GLAUCOMA SURGERY     Social History   Social History Narrative   Right Handed    Lives in a two  story home    Immunization History  Administered Date(s) Administered   PPD Test 02/26/2024     Objective: Vital Signs: BP 98/67 (BP Location: Left Arm, Patient Position: Sitting, Cuff Size: Small)   Pulse 81   Resp 16   Ht 5' 6 (1.676 m)   Wt 138 lb (62.6 kg)   LMP 02/25/2024 (Exact Date)   BMI 22.27 kg/m    Physical Exam Vitals and nursing note reviewed.   Constitutional:      Appearance: She is well-developed.  HENT:     Head: Normocephalic and atraumatic.   Eyes:     Conjunctiva/sclera: Conjunctivae normal.    Cardiovascular:     Rate and Rhythm: Normal rate and regular rhythm.     Heart sounds: Normal heart sounds.  Pulmonary:     Effort: Pulmonary effort is normal.     Breath sounds: Normal breath sounds.  Abdominal:     General: Bowel sounds are normal.     Palpations: Abdomen is soft.   Musculoskeletal:     Cervical back: Normal range of motion.  Lymphadenopathy:     Cervical: No cervical adenopathy.   Skin:    General: Skin is warm and dry.     Capillary Refill: Capillary refill takes less than 2 seconds.   Neurological:     Mental Status: She is alert and oriented to person, place, and time.   Psychiatric:        Behavior: Behavior normal.      Musculoskeletal Exam: C-spine, thoracic spine, lumbar spine and good range of motion.  No midline spinal tenderness.  Mild tenderness over both SI joints.  Shoulder joints, elbow joints, wrist joints, MCPs, PIPs, DIPs have good range of motion with no synovitis.  Complete fist formation bilaterally.  Hip joints have good range of motion with no groin pain.  Knee joints have good range of motion no warmth or effusion.  Ankle joints have good range of motion with no tenderness or joint swelling.  No evidence of Achilles tendinitis or plantar fasciitis.  Mild edema noted on the dorsal aspect of both feet.  No pitting noted.  No tenderness or synovitis over MTP joints.  CDAI Exam: CDAI Score: -- Patient Global: --; Provider Global: -- Swollen: --; Tender: -- Joint Exam 03/20/2024   No joint exam has been documented for this visit   There is currently no information documented on the homunculus. Go to the Rheumatology activity and complete the homunculus joint exam.  Investigation: No additional findings.  Imaging: No results found.  Recent Labs: Lab Results   Component Value Date   WBC 4.9 12/29/2023   HGB 11.7 12/29/2023   PLT 206 12/29/2023   NA 139 12/29/2023   K 4.9 12/29/2023   CL 108 12/29/2023   CO2 27 12/29/2023   GLUCOSE 85 12/29/2023   BUN 9 12/29/2023   CREATININE 0.76 12/29/2023   BILITOT 0.6 12/29/2023   ALKPHOS 47 07/23/2020   AST 19 12/29/2023   ALT 13 12/29/2023   PROT 7.1 12/29/2023   ALBUMIN 4.1 07/23/2020   CALCIUM 9.3 12/29/2023   QFTBGOLDPLUS NEGATIVE 10/16/2023    Speciality Comments: Diagnosed in [place].  Treated with Remicade, Humira and Simponi per patient.    Appointment every 3 months  Procedures:  No procedures performed Allergies: Patient has no known allergies.    Assessment / Plan:     Visit Diagnoses: Ankylosing spondylitis of multiple sites in spine [facility]): No synovitis or dactylitis noted on  examination today.  No midline spinal tenderness.  Appropriate posture noticed on exam.  Mild tenderness over both SI joints.  Hip joints have good range of motion with no groin pain.  Patient remains on Cimzia  40 mg subcutaneous injections once monthly.  Patient was due for her Cimzia  injection on 03/12/2024 but did not receive the shipment of Cimzia .  Plan to contact the pharmacy to check on the status of the shipment.  Overall she continues to find Cimzia  to be effective at managing her symptoms.  She has noticed decreased grip strength in both hands is encouraged to perform hand exercises.  She was given a handout of hand exercises to perform. Patient will remain on Cimzia  as monotherapy.  She was advised to notify us  if she develops any new or worsening symptoms.  She will follow-up in the office in 5 months or sooner if needed.  High risk medication use -Cimzia  400 mg subcutaneous injections once monthly. Previous therapy includes: Taltz , Rasuvo , Remicade, Humira, and Simponi. Planning to receive Depo-Provera  injection on 03/27/2024.  Not currently planning pregnancy. TB gold negative on 10/16/23.  CBC and  CMP updated on 12/29/23. Orders for CBC and CMP released today.  Her next lab work will be due in September and every 3 months to monitor for drug toxicity. No recent or recurrent infections.  Discussed the importance of holding cimzia  if she develops signs or symptoms of an infection and to resume once the infection has completely cleared.   Plan: CBC with Differential/Platelet, Comprehensive metabolic panel with GFR  Elevated LFTs -LFTs within normal limits on 12/29/2023.  CMP updated today.  Plan: Comprehensive metabolic panel with GFR  Panuveitis of both eyes: No flares of uveitis recently.  She will remain on Cimzia  as prescribed.  She is scheduled for an upcoming visit with ophthalmology.  Pustular psoriasis: No recurrence.  Currently well-controlled on Cimzia .  Long term (current) use of systemic steroids: No longer on prednisone .  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  Myofascial pain: She experiences intermittent allergies and muscle tenderness due to myofascial pain.  Other fatigue: Chronic, stable.  Plantar fasciitis, bilateral: Not currently symptomatic.  No evidence of Achilles tendinitis or plantar fasciitis.  No tenderness or synovitis over MTP joints.  No synovitis or dactylitis noted.  Orders: Orders Placed This Encounter  Procedures   CBC with Differential/Platelet   Comprehensive metabolic panel with GFR   No orders of the defined types were placed in this encounter.   Follow-Up Instructions: Return in about 5 months (around 08/20/2024) for Ankylosing Spondylitis.   Waddell CHRISTELLA Craze, PA-C  Note - This record has been created using Dragon software.  Chart creation errors have been sought, but may not always  have been located. Such creation errors do not reflect on  the standard of medical care.

## 2024-03-20 ENCOUNTER — Ambulatory Visit: Attending: Physician Assistant | Admitting: Physician Assistant

## 2024-03-20 ENCOUNTER — Other Ambulatory Visit: Payer: Self-pay

## 2024-03-20 ENCOUNTER — Encounter: Payer: Self-pay | Admitting: Physician Assistant

## 2024-03-20 ENCOUNTER — Other Ambulatory Visit (HOSPITAL_COMMUNITY): Payer: Self-pay

## 2024-03-20 VITALS — BP 98/67 | HR 81 | Resp 16 | Ht 66.0 in | Wt 138.0 lb

## 2024-03-20 DIAGNOSIS — M533 Sacrococcygeal disorders, not elsewhere classified: Secondary | ICD-10-CM | POA: Insufficient documentation

## 2024-03-20 DIAGNOSIS — Z79899 Other long term (current) drug therapy: Secondary | ICD-10-CM

## 2024-03-20 DIAGNOSIS — L401 Generalized pustular psoriasis: Secondary | ICD-10-CM | POA: Diagnosis present

## 2024-03-20 DIAGNOSIS — M722 Plantar fascial fibromatosis: Secondary | ICD-10-CM | POA: Insufficient documentation

## 2024-03-20 DIAGNOSIS — M7918 Myalgia, other site: Secondary | ICD-10-CM | POA: Insufficient documentation

## 2024-03-20 DIAGNOSIS — G8929 Other chronic pain: Secondary | ICD-10-CM | POA: Diagnosis present

## 2024-03-20 DIAGNOSIS — R7989 Other specified abnormal findings of blood chemistry: Secondary | ICD-10-CM | POA: Insufficient documentation

## 2024-03-20 DIAGNOSIS — M450 Ankylosing spondylitis of multiple sites in spine: Secondary | ICD-10-CM | POA: Insufficient documentation

## 2024-03-20 DIAGNOSIS — H44113 Panuveitis, bilateral: Secondary | ICD-10-CM | POA: Diagnosis present

## 2024-03-20 DIAGNOSIS — Z7952 Long term (current) use of systemic steroids: Secondary | ICD-10-CM | POA: Diagnosis present

## 2024-03-20 DIAGNOSIS — R5383 Other fatigue: Secondary | ICD-10-CM | POA: Insufficient documentation

## 2024-03-20 MED ORDER — CIMZIA (2 SYRINGE) 200 MG/ML ~~LOC~~ PSKT
400.0000 mg | PREFILLED_SYRINGE | SUBCUTANEOUS | 0 refills | Status: DC
Start: 2024-03-20 — End: 2024-07-15
  Filled 2024-03-20: qty 3, 84d supply, fill #0
  Filled 2024-04-01 – 2024-04-26 (×2): qty 1, 28d supply, fill #0
  Filled 2024-05-21: qty 1, 28d supply, fill #1
  Filled 2024-06-14: qty 1, 28d supply, fill #2

## 2024-03-20 NOTE — Patient Instructions (Addendum)
 Standing Labs We placed an order today for your standing lab work.   Please have your standing labs drawn in September and every 3months   Please have your labs drawn 2 weeks prior to your appointment so that the provider can discuss your lab results at your appointment, if possible.  Please note that you may see your imaging and lab results in MyChart before we have reviewed them. We will contact you once all results are reviewed. Please allow our office up to 72 hours to thoroughly review all of the results before contacting the office for clarification of your results.  WALK-IN LAB HOURS  Monday through Thursday from 8:00 am -12:30 pm and 1:00 pm-4:00 pm and Friday from 8:00 am-12:00 pm.  Patients with office visits requiring labs will be seen before walk-in labs.  You may encounter longer than normal wait times. Please allow additional time. Wait times may be shorter on  Monday and Thursday afternoons.  We do not book appointments for walk-in labs. We appreciate your patience and understanding with our staff.   Labs are drawn by [facility]. Please bring your co-pay at the time of your lab draw.  You may receive a bill from [facility] for your lab work.  Please note if you are on Hydroxychloroquine and and an order has been placed for a Hydroxychloroquine level,  you will need to have it drawn 4 hours or more after your last dose.  If you wish to have your labs drawn at another location, please call the office 24 hours in advance so we can fax the orders.  The office is located at [address], [address], [place], [place] [address]   If you have any questions regarding directions or hours of operation,  please call [phone].   As a reminder, please drink plenty of water prior to coming for your lab work. Thanks!     Hand exercises can be helpful for almost anyone. They can strengthen your hands and improve flexibility and movement. The exercises can also increase blood flow to the  hands. These results can make your work and daily tasks easier for you. Hand exercises can be especially helpful for people who have joint pain from arthritis or nerve damage from using their hands over and over. These exercises can also help people who injure a hand. Exercises Most of these hand exercises are gentle stretching and motion exercises. It is usually safe to do them often throughout the day. Warming up your hands before exercise may help reduce stiffness. You can do this with gentle massage or by placing your hands in warm water for 10-15 minutes. It is normal to feel some stretching, pulling, tightness, or mild discomfort when you begin new exercises. In time, this will improve. Remember to always be careful and stop right away if you feel sudden, very bad pain or your pain gets worse. You want to get better and be safe. Ask your health care provider which exercises are safe for you. Do exercises exactly as told by your provider and adjust them as told. Do not begin these exercises until told by your provider. Knuckle bend or claw fist  Stand or sit with your arm, hand, and all five fingers pointed straight up. Make sure to keep your wrist straight. Gently bend your fingers down toward your palm until the tips of your fingers are touching your palm. Keep your big knuckle straight and only bend the small knuckles in your fingers. Hold this position for  10 seconds. Straighten your fingers back to your starting position. Repeat this exercise 5-10 times with each hand. Full finger fist  Stand or sit with your arm, hand, and all five fingers pointed straight up. Make sure to keep your wrist straight. Gently bend your fingers into your palm until the tips of your fingers are touching the middle of your palm. Hold this position for 10 seconds. Extend your fingers back to your starting position, stretching every joint fully. Repeat this exercise 5-10 times with each hand. Straight  fist  Stand or sit with your arm, hand, and all five fingers pointed straight up. Make sure to keep your wrist straight. Gently bend your fingers at the big knuckle, where your fingers meet your hand, and at the middle knuckle. Keep the knuckle at the tips of your fingers straight and try to touch the bottom of your palm. Hold this position for 10 seconds. Extend your fingers back to your starting position, stretching every joint fully. Repeat this exercise 5-10 times with each hand. Tabletop  Stand or sit with your arm, hand, and all five fingers pointed straight up. Make sure to keep your wrist straight. Gently bend your fingers at the big knuckle, where your fingers meet your hand, as far down as you can. Keep the small knuckles in your fingers straight. Think of forming a tabletop with your fingers. Hold this position for 10 seconds. Extend your fingers back to your starting position, stretching every joint fully. Repeat this exercise 5-10 times with each hand. Finger spread  Place your hand flat on a table with your palm facing down. Make sure your wrist stays straight. Spread your fingers and thumb apart from each other as far as you can until you feel a gentle stretch. Hold this position for 10 seconds. Bring your fingers and thumb tight together again. Hold this position for 10 seconds. Repeat this exercise 5-10 times with each hand. Making circles  Stand or sit with your arm, hand, and all five fingers pointed straight up. Make sure to keep your wrist straight. Make a circle by touching the tip of your thumb to the tip of your index finger. Hold for 10 seconds. Then open your hand wide. Repeat this motion with your thumb and each of your fingers. Repeat this exercise 5-10 times with each hand. Thumb motion  Sit with your forearm resting on a table and your wrist straight. Your thumb should be facing up toward the ceiling. Keep your fingers relaxed as you move your thumb. Lift  your thumb up as high as you can toward the ceiling. Hold for 10 seconds. Bend your thumb across your palm as far as you can, reaching the tip of your thumb for the small finger (pinkie) side of your palm. Hold for 10 seconds. Repeat this exercise 5-10 times with each hand. Grip strengthening  Hold a stress ball or other soft ball in the middle of your hand. Slowly increase the pressure, squeezing the ball as much as you can without causing pain. Think of bringing the tips of your fingers into the middle of your palm. All of your finger joints should bend when doing this exercise. Hold your squeeze for 10 seconds, then relax. Repeat this exercise 5-10 times with each hand. Contact a health care provider if: Your hand pain or discomfort gets much worse when you do an exercise. Your hand pain or discomfort does not improve within 2 hours after you exercise. If you have either of  these problems, stop doing these exercises right away. Do not do them again unless your provider says that you can. Get help right away if: You develop sudden, severe hand pain or swelling. If this happens, stop doing these exercises right away. Do not do them again unless your provider says that you can. This information is not intended to replace advice given to you by your health care provider. Make sure you discuss any questions you have with your health care provider. Document Revised: 09/27/2022 Document Reviewed: 09/27/2022 Elsevier Patient Education  2024 [employer].

## 2024-03-20 NOTE — Progress Notes (Signed)
 New tracking number for patient resend- [id number]

## 2024-03-20 NOTE — Telephone Encounter (Signed)
 Please review and sign

## 2024-03-21 ENCOUNTER — Ambulatory Visit: Payer: Self-pay | Admitting: Physician Assistant

## 2024-03-21 ENCOUNTER — Other Ambulatory Visit (HOSPITAL_COMMUNITY): Payer: Self-pay

## 2024-03-21 LAB — CBC WITH DIFFERENTIAL/PLATELET
Absolute Lymphocytes: 2349 {cells}/uL (ref 850–3900)
Absolute Monocytes: 708 {cells}/uL (ref 200–950)
Basophils Absolute: 49 {cells}/uL (ref 0–200)
Basophils Relative: 0.8 %
Eosinophils Absolute: 244 {cells}/uL (ref 15–500)
Eosinophils Relative: 4 %
HCT: 40 % (ref 35.0–45.0)
Hemoglobin: 12.8 g/dL (ref 11.7–15.5)
MCH: 27.9 pg (ref 27.0–33.0)
MCHC: 32 g/dL (ref 32.0–36.0)
MCV: 87.1 fL (ref 80.0–100.0)
MPV: 11.1 fL (ref 7.5–12.5)
Monocytes Relative: 11.6 %
Neutro Abs: 2751 {cells}/uL (ref 1500–7800)
Neutrophils Relative %: 45.1 %
Platelets: 210 10*3/uL (ref 140–400)
RBC: 4.59 10*6/uL (ref 3.80–5.10)
RDW: 13.8 % (ref 11.0–15.0)
Total Lymphocyte: 38.5 %
WBC: 6.1 10*3/uL (ref 3.8–10.8)

## 2024-03-21 LAB — COMPREHENSIVE METABOLIC PANEL WITH GFR
AG Ratio: 1.6 (calc) (ref 1.0–2.5)
ALT: 18 U/L (ref 6–29)
AST: 21 U/L (ref 10–30)
Albumin: 4.8 g/dL (ref 3.6–5.1)
Alkaline phosphatase (APISO): 56 U/L (ref 31–125)
BUN: 14 mg/dL (ref 7–25)
CO2: 21 mmol/L (ref 20–32)
Calcium: 9.8 mg/dL (ref 8.6–10.2)
Chloride: 105 mmol/L (ref 98–110)
Creat: 0.92 mg/dL (ref 0.50–0.97)
Globulin: 3 g/dL (ref 1.9–3.7)
Glucose, Bld: 75 mg/dL (ref 65–99)
Potassium: 4.4 mmol/L (ref 3.5–5.3)
Sodium: 138 mmol/L (ref 135–146)
Total Bilirubin: 0.5 mg/dL (ref 0.2–1.2)
Total Protein: 7.8 g/dL (ref 6.1–8.1)
eGFR: 85 mL/min/{1.73_m2} (ref >=60)

## 2024-03-21 NOTE — Progress Notes (Signed)
 CBC and CMP WNL

## 2024-03-26 ENCOUNTER — Ambulatory Visit: Admitting: Obstetrics and Gynecology

## 2024-03-26 ENCOUNTER — Other Ambulatory Visit (HOSPITAL_COMMUNITY)
Admission: RE | Admit: 2024-03-26 | Discharge: 2024-03-26 | Disposition: A | Discharge status: Home / Self Care | Source: Ambulatory Visit | Attending: Obstetrics and Gynecology | Admitting: Obstetrics and Gynecology

## 2024-03-26 ENCOUNTER — Encounter: Payer: Self-pay | Admitting: Obstetrics and Gynecology

## 2024-03-26 ENCOUNTER — Ambulatory Visit
Admission: RE | Admit: 2024-03-26 | Discharge: 2024-03-26 | Disposition: A | Discharge status: Home / Self Care | Source: Ambulatory Visit | Attending: Internal Medicine | Admitting: Internal Medicine

## 2024-03-26 VITALS — BP 115/78 | HR 84 | Temp 98.3°F | Resp 16

## 2024-03-26 VITALS — BP 116/76 | HR 96 | Ht 64.0 in | Wt 139.0 lb

## 2024-03-26 DIAGNOSIS — R102 Pelvic and perineal pain: Secondary | ICD-10-CM

## 2024-03-26 DIAGNOSIS — Z3202 Encounter for pregnancy test, result negative: Secondary | ICD-10-CM

## 2024-03-26 DIAGNOSIS — Z3042 Encounter for surveillance of injectable contraceptive: Secondary | ICD-10-CM

## 2024-03-26 DIAGNOSIS — Z113 Encounter for screening for infections with a predominantly sexual mode of transmission: Secondary | ICD-10-CM | POA: Insufficient documentation

## 2024-03-26 DIAGNOSIS — Z01419 Encounter for gynecological examination (general) (routine) without abnormal findings: Secondary | ICD-10-CM | POA: Diagnosis not present

## 2024-03-26 DIAGNOSIS — K047 Periapical abscess without sinus: Secondary | ICD-10-CM | POA: Diagnosis not present

## 2024-03-26 DIAGNOSIS — K0889 Other specified disorders of teeth and supporting structures: Secondary | ICD-10-CM

## 2024-03-26 LAB — POCT URINE PREGNANCY: Preg Test, Ur: NEGATIVE

## 2024-03-26 MED ORDER — MEDROXYPROGESTERONE ACETATE 150 MG/ML IM SUSP: 150.0000 mg | INTRAMUSCULAR | 4 refills | Status: AC

## 2024-03-26 MED ORDER — MEDROXYPROGESTERONE ACETATE 150 MG/ML IM SUSP
150.0000 mg | Freq: Once | INTRAMUSCULAR | Status: AC
  Administered 2024-03-26: 150 mg via INTRAMUSCULAR

## 2024-03-26 MED ORDER — AMOXICILLIN-POT CLAVULANATE 875-125 MG PO TABS: 1.0000 | ORAL_TABLET | Freq: Two times a day (BID) | ORAL | 0 refills | Status: AC

## 2024-03-26 MED ORDER — FLUCONAZOLE 150 MG PO TABS
150.0000 mg | ORAL_TABLET | ORAL | 0 refills | Status: AC
Start: 2024-03-26 — End: ?

## 2024-03-26 NOTE — Progress Notes (Signed)
 Administrations This Visit     medroxyPROGESTERone  (DEPO-PROVERA ) injection 150 mg     Admin Date 03/26/2024 Action Given Dose 150 mg Route Intramuscular Documented By Burnard Niels PARAS, RMA            Depo given in [place], tolerated well.   Next DEPO Injection due Sept. 13-30, 2025

## 2024-03-26 NOTE — Progress Notes (Signed)
 GYNECOLOGY ANNUAL PREVENTATIVE CARE ENCOUNTER NOTE  History:     Bethany Li is a 33 y.o. G1P0 female here for a routine annual gynecologic exam.  Current complaints: intermittent pelvic pain.   Denies abnormal vaginal bleeding, discharge,  or other gynecologic concerns.   Pt notes 5 year hx of intermittent mild to moderate pelvic pain.  Intercourse is usually mildly uncomfortable but may be associated with inadequate lubrication.  Pain is not associated with food.  Pt denies constipation/diarrhea, there is no dysuria.  Nothing makes the pain worse or better.   Gynecologic History Patient's last menstrual period was 02/25/2024 (exact date). Contraception: Depo-Provera  injections Last Pap: 2024. Results were: normal with negative HPV Last mammogram: n/a.   Obstetric History OB History  Gravida Para Term Preterm AB Living  1     1  SAB IAB Ectopic Multiple Live Births      1    # Outcome Date GA Lbr Len/2nd Weight Sex Type Anes PTL Lv  1 Gravida 06/30/17    F [id number]-[id number]   LIV    Past Medical History:  Diagnosis Date   Anemia    Ankylosing spondylitis (HCC)    Arthritis     Past Surgical History:  Procedure Laterality Date   CESAREAN SECTION     GLAUCOMA SURGERY      Current Outpatient Medications on File Prior to Visit  Medication Sig Dispense Refill   certolizumab pegol  (CIMZIA , 2 SYRINGE,) 200 MG/ML prefilled syringe Inject 400 mg into the skin every 28 (twenty-eight) days. 3 each 0   ALPHAGAN P 0.1 % SOLN Apply 1 drop to eye 3 (three) times daily. (Patient not taking: Reported on 03/20/2024)     ketorolac (ACULAR) 0.5 % ophthalmic solution Place 1 drop into both eyes in the morning and at bedtime.     mometasone  (ELOCON ) 0.1 % cream Apply 1 Application topically daily as needed (Rash). 1-2 times a day for up to 5 days a week to spot treat aa. Avoid applying to face, groin, and axilla. Use as directed. Long-term use can cause thinning of the skin. 45 g 1    prednisoLONE acetate (PRED FORTE) 1 % ophthalmic suspension Place 1 drop into both eyes daily.     No current facility-administered medications on file prior to visit.    No Known Allergies  Social History:  reports that she has never smoked. She has been exposed to tobacco smoke. She has never used smokeless tobacco. She reports current alcohol use. She reports that she does not currently use drugs.  Family History  Problem Relation Age of Onset   Rheum arthritis Father     The following portions of the patient's history were reviewed and updated as appropriate: allergies, current medications, past family history, past medical history, past social history, past surgical history and problem list.  Review of Systems Pertinent items noted in HPI and remainder of comprehensive ROS otherwise negative.  Physical Exam:  BP 116/76   Pulse 96   Ht 5' 4 (1.626 m)   Wt 139 lb (63 kg)   LMP 02/25/2024 (Exact Date)   BMI 23.86 kg/m  CONSTITUTIONAL: Well-developed, well-nourished female in no acute distress.  HENT:  Normocephalic, atraumatic, External right and left ear normal. Oropharynx is clear and moist EYES: Conjunctivae and EOM are normal.  NECK: Normal range of motion, supple, no masses.  Normal thyroid.  SKIN: Skin is warm and dry. No rash noted. Not diaphoretic. No erythema. No pallor. MUSCULOSKELETAL:  Normal range of motion. No tenderness.  No cyanosis, clubbing, or edema.  2+ distal pulses. NEUROLOGIC: Alert and oriented to person, place, and time. Normal reflexes, muscle tone coordination.  PSYCHIATRIC: Normal mood and affect. Normal behavior. Normal judgment and thought content. CARDIOVASCULAR: Normal heart rate noted, regular rhythm RESPIRATORY: Clear to auscultation bilaterally. Effort and breath sounds normal, no problems with respiration noted. BREASTS: Symmetric in size. No masses, tenderness, skin changes, nipple drainage, or lymphadenopathy bilaterally. Bilateral normal  breasts. Performed in the presence of a chaperone. ABDOMEN: Soft, no distention noted.  No tenderness, rebound or guarding. C/s scar noted, no abnormal scarring PELVIC: Normal appearing external genitalia and urethral meatus; normal appearing vaginal mucosa and cervix.  No abnormal discharge noted.  Vaginal swab obtained.  Normal uterine size, no other palpable masses, no uterine or adnexal tenderness.  Performed in the presence of a chaperone.   Assessment and Plan:    1. Women's annual routine gynecological examination (Primary) Normal annual exam  2. Routine screening for STI (sexually transmitted infection) Per patient request - medroxyPROGESTERone  (DEPO-PROVERA ) injection 150 mg - HIV Antibody (routine testing w rflx) - Hepatitis B surface antigen - RPR - Hepatitis C antibody - Cervicovaginal ancillary only( [place])  3. Encounter for Depo-Provera  contraception Info given on nexplanon and mirena for long term contraception. - POCT urine pregnancy - medroxyPROGESTERone  (DEPO-PROVERA ) 150 MG/ML injection; Inject 1 mL (150 mg total) into the muscle every 3 (three) months.  Dispense: 1 mL; Refill: 4  4. Pelvic pain in female Physical exam grossly normal with no replication of pelvic pain, will check ultrasound to eval anatomy - US  PELVIC COMPLETE WITH TRANSVAGINAL; Future  Routine preventative health maintenance measures emphasized. Please refer to After Visit Summary for other counseling recommendations.     Virtual visit in 6 weeks  Jerilynn Buddle, MD, FACOG Obstetrician & Gynecologist, [facility] for [employer], [facility]

## 2024-03-26 NOTE — ED Provider Notes (Signed)
 [facility]    CSN: [id number] Arrival date & time: 03/26/24  1601      History   Chief Complaint Chief Complaint  Patient presents with   Oral Swelling    Entered by patient    HPI Bethany Li is a 33 y.o. female.   Bethany Li is a 33 y.o. female presenting for chief complaint of dental pain and swelling to the left upper mouth where she has previously had bone graft performed that started 2-3 days ago. She first noticed a pink dot to the area of pain/swelling. She has not seen any drainage from the oral lesion or gums. Denies recent trauma/injuries to the left upper mouth. She had bone grafting and dental implants placed 2 years ago, screw to the left upper mouth fell out 1.5 years ago. She has been trying to find a new Designer, industrial/product to fix implants. She brushes teeth regularly. Denies recent fever, chills, nausea, vomiting, neck pain, dizziness, ear pain, and antibiotic use. LMP 02/25/2024, denies chance of pregnancy. Taking OTC medications with some relief.      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 Authorizing Provider  amoxicillin-clavulanate (AUGMENTIN) 875-125 MG tablet Take 1 tablet by mouth every 12 (twelve) hours. 03/26/24  Yes Enedelia Dorna HERO, FNP  fluconazole  (DIFLUCAN ) 150 MG tablet Take 1 tablet (150 mg total) by mouth every 3 (three) days. 03/26/24  Yes Kaylla Cobos, Dorna HERO, FNP  ALPHAGAN P 0.1 % SOLN Apply 1 drop to eye 3 (three) times daily. Patient not taking: Reported on 03/20/2024 08/31/21   [provider]  certolizumab pegol  (CIMZIA , 2 SYRINGE,) 200 MG/ML prefilled syringe Inject 400 mg into the skin every 28 (twenty-eight) days. 03/20/24   Cheryl Waddell HERO, PA-C  ketorolac (ACULAR) 0.5 % ophthalmic solution Place 1 drop into both eyes in the morning and at bedtime. 08/31/21   [provider]  medroxyPROGESTERone  (DEPO-PROVERA ) 150 MG/ML injection Inject 1 mL (150 mg total) into the muscle every 3 (three) months. 03/26/24   Zina Jerilynn LABOR, MD  mometasone  (ELOCON ) 0.1 % cream Apply 1 Application topically daily as needed (Rash). 1-2 times a day for up to 5 days a week to spot treat aa. Avoid applying to face, groin, and axilla. Use as directed. Long-term use can cause thinning of the skin. 11/07/23   Hester Alm BROCKS, MD  prednisoLONE acetate (PRED FORTE) 1 %  ophthalmic suspension Place 1 drop into both eyes daily.    [provider]    Family History Family History  Problem Relation Age of Onset   Rheum arthritis Father     Social History Social History   Tobacco Use   Smoking status: Never    Passive exposure: Past   Smokeless tobacco: Never  Vaping Use   Vaping status: Former   Substances: Nicotine, Flavoring  Substance Use Topics   Alcohol use: Yes    Comment: occassionally   Drug use: Not Currently     Allergies   Patient has no known allergies.   Review of Systems Review of Systems Per HPI  Physical Exam Triage Vital Signs ED Triage Vitals [03/26/24 1604]  Encounter Vitals Group     BP 115/78     Girls Systolic BP Percentile      Girls Diastolic BP Percentile      Boys Systolic BP Percentile      Boys Diastolic BP Percentile      Pulse Rate 84     Resp 16      Temp 98.3 F (36.8 C)     Temp Source Oral     SpO2 99 %     Weight      Height      Head Circumference      Peak Flow      Pain Score      Pain Loc      Pain Education      Exclude from Growth Chart    No data found.  Updated Vital Signs BP 115/78 (BP Location: Left Arm)   Pulse 84   Temp 98.3 F (36.8 C) (Oral)   Resp 16   LMP 02/25/2024 (Exact Date)   SpO2 99%   Visual Acuity Right Eye Distance:   Left Eye Distance:   Bilateral Distance:    Right Eye Near:   Left Eye Near:    Bilateral Near:     Physical Exam Vitals and nursing note reviewed.  Constitutional:      Appearance: She is not ill-appearing or toxic-appearing.  HENT:     Head: Normocephalic and atraumatic.     Right Ear: Hearing, tympanic membrane, ear canal and external ear normal.     Left Ear: Hearing, tympanic membrane, ear canal and external ear normal.     Nose: Nose normal.     Mouth/Throat:     Lips: Pink.     Mouth: Mucous membranes are moist. No injury or oral lesions.     Dentition: Abnormal dentition. Does not have dentures. Dental tenderness and gum lesions present. No gingival swelling, dental caries or dental abscesses.     Tongue: No lesions.     Pharynx: Oropharynx is clear. Uvula midline. No pharyngeal swelling, oropharyngeal exudate, posterior oropharyngeal erythema, uvula swelling or postnasal drip.     Tonsils: No tonsillar exudate.    Eyes:     General: Lids are normal. Vision grossly intact. Gaze aligned appropriately.     Extraocular Movements: Extraocular movements intact.     Conjunctiva/sclera: Conjunctivae normal.   Neck:     Trachea: Trachea and phonation normal.  Pulmonary:     Effort: Pulmonary effort is normal.   Musculoskeletal:     Cervical back: Full passive range of motion without pain, normal range of motion and neck supple.  Lymphadenopathy:     Cervical: No cervical adenopathy.   Skin:    General: Skin is warm and  dry.     Capillary Refill:  Capillary refill takes less than 2 seconds.     Findings: No rash.   Neurological:     General: No focal deficit present.     Mental Status: She is alert and oriented to person, place, and time. Mental status is at baseline.     Cranial Nerves: No dysarthria or facial asymmetry.   Psychiatric:        Mood and Affect: Mood normal.        Speech: Speech normal.        Behavior: Behavior normal.        Thought Content: Thought content normal.        Judgment: Judgment normal.    Pain and swelling of left upper mouth   UC Treatments / Results  Labs (all labs ordered are listed, but only abnormal results are displayed) Labs Reviewed - No data to display  EKG   Radiology No results found.  Procedures Procedures (including critical care time)  Medications Ordered in UC Medications - No data to display  Initial Impression / Assessment and Plan / UC Course  I have reviewed the triage vital signs and the nursing notes.  Pertinent labs & imaging results that were available during my care of the patient were reviewed by me and considered in my medical decision making (see chart for details).   1. Dental infection, dental pain Evaluation suggests dental pain secondary to gingival infection.   HEENT exam stable and without red flag signs indicating need for advanced imaging/further emergent workup. Patient is afebrile, nontoxic in appearance, and with hemodynamically stable vital signs.   Antibiotic ordered.  Recommend supportive care for symptomatic relief as outlined in AVS.   Encouraged to follow-up with dentist/surgeon for further management.   Counseled patient on potential for adverse effects with medications prescribed/recommended today, strict ER and return-to-clinic precautions discussed, patient verbalized understanding.    Final Clinical Impressions(s) / UC Diagnoses   Final diagnoses:  Dental infection  Pain, dental     Discharge Instructions      Your  dental pain is likely due to dental infection. Take  antibiotic as prescribed for the next 7 days to treat your dental infection. Continue use of ibuprofen as needed with food for dental inflammation and pain.   You may also use tylenol  as needed for pain. Perform salt water gargles every 3-4 hours.  If you develop any new or worsening symptoms or if your symptoms do not start to improve, pleases return here or follow-up with your primary care provider. If your symptoms are severe, please go to the emergency room.    ED Prescriptions     Medication Sig Dispense Auth. Provider   amoxicillin-clavulanate (AUGMENTIN) 875-125 MG tablet Take 1 tablet by mouth every 12 (twelve) hours. 14 tablet Enedelia Dorna HERO, FNP   fluconazole  (DIFLUCAN ) 150 MG tablet Take 1 tablet (150 mg total) by mouth every 3 (three) days. 2 tablet Enedelia Dorna HERO, FNP      PDMP not reviewed this encounter.   Enedelia Dorna HERO, [place] 03/26/24 [phone]

## 2024-03-26 NOTE — ED Triage Notes (Signed)
 Pt c/o of mouth pain near tooth at roof of mouth for about 2 weeks. States she had screw fall out over 2 months ago but has not had a problem until recently.

## 2024-03-26 NOTE — Progress Notes (Signed)
 Pt is in office for AEX. Last pap 2024- negative. Pt complains of lower pelvic pains, frequent BV and left breast issue.  Pt would like to discuss [facility]-  has had Depo in the past.  UPT in office is negative.

## 2024-03-26 NOTE — Discharge Instructions (Addendum)
 Your dental pain is likely due to dental infection. Take  antibiotic as prescribed for the next 7 days to treat your dental infection. Continue use of ibuprofen as needed with food for dental inflammation and pain.   You may also use tylenol as needed for pain. Perform salt water gargles every 3-4 hours.  If you develop any new or worsening symptoms or if your symptoms do not start to improve, pleases return here or follow-up with your primary care provider. If your symptoms are severe, please go to the emergency room.

## 2024-03-27 ENCOUNTER — Ambulatory Visit: Payer: Self-pay | Admitting: Obstetrics and Gynecology

## 2024-03-27 DIAGNOSIS — B9689 Other specified bacterial agents as the cause of diseases classified elsewhere: Secondary | ICD-10-CM

## 2024-03-27 LAB — RPR: RPR Ser Ql: NONREACTIVE

## 2024-03-27 LAB — HEPATITIS B SURFACE ANTIGEN: Hepatitis B Surface Ag: NEGATIVE

## 2024-03-27 LAB — HEPATITIS C ANTIBODY: Hep C Virus Ab: NONREACTIVE

## 2024-03-27 LAB — HIV ANTIBODY (ROUTINE TESTING W REFLEX): HIV Screen 4th Generation wRfx: NONREACTIVE

## 2024-03-28 LAB — CERVICOVAGINAL ANCILLARY ONLY
Bacterial Vaginitis (gardnerella): POSITIVE — AB
Candida Glabrata: NEGATIVE
Candida Vaginitis: NEGATIVE
Chlamydia: NEGATIVE
Comment: NEGATIVE
Comment: NEGATIVE
Comment: NEGATIVE
Comment: NEGATIVE
Comment: NEGATIVE
Comment: NORMAL
Neisseria Gonorrhea: NEGATIVE
Trichomonas: NEGATIVE

## 2024-03-28 MED ORDER — METRONIDAZOLE 500 MG PO TABS: 500.0000 mg | ORAL_TABLET | Freq: Two times a day (BID) | ORAL | 0 refills | Status: AC

## 2024-03-28 NOTE — Telephone Encounter (Addendum)
-----   Message from Jerilynn DELENA Buddle sent at 03/28/2024  1:30 PM EDT ----- Bv noted on swab offer treatment ----- Message ----- From: Jerrye Elvie BIRCH, CMA Sent: 03/26/2024   3:14 PM EDT To: Jerilynn DELENA Buddle, MD  7/3  1450  Called pt and she did not answer.  Message left stating that she should check her Mychart for additional test result information. Rx for metronidazole  e-prescribed to pharmacy.  Mychart message sent to pt regarding test result and plan of care.

## 2024-04-01 ENCOUNTER — Other Ambulatory Visit: Payer: Self-pay

## 2024-04-01 NOTE — Progress Notes (Signed)
 Encounter opened in error.

## 2024-04-03 ENCOUNTER — Other Ambulatory Visit: Payer: Self-pay

## 2024-04-03 ENCOUNTER — Ambulatory Visit (HOSPITAL_COMMUNITY)
Admission: RE | Admit: 2024-04-03 | Discharge: 2024-04-03 | Disposition: A | Discharge status: Home / Self Care | Source: Ambulatory Visit | Attending: Obstetrics and Gynecology | Admitting: Obstetrics and Gynecology

## 2024-04-03 DIAGNOSIS — R102 Pelvic and perineal pain: Secondary | ICD-10-CM | POA: Diagnosis present

## 2024-04-06 ENCOUNTER — Ambulatory Visit (HOSPITAL_COMMUNITY)

## 2024-04-26 ENCOUNTER — Other Ambulatory Visit: Payer: Self-pay

## 2024-04-26 NOTE — Progress Notes (Signed)
 Specialty Pharmacy Refill Coordination Note  Bethany Li is a 33 y.o. female contacted today regarding refills of specialty medication(s) Certolizumab Pegol  (Cimzia  (2 Syringe))   Patient requested Delivery   Delivery date: 04/30/24   Verified address: [address], [address]  [place]  [id number]   Medication will be filled on 04/29/24.

## 2024-05-14 ENCOUNTER — Telehealth (INDEPENDENT_AMBULATORY_CARE_PROVIDER_SITE_OTHER): Admitting: Obstetrics and Gynecology

## 2024-05-14 DIAGNOSIS — R102 Pelvic and perineal pain: Secondary | ICD-10-CM | POA: Diagnosis not present

## 2024-05-14 NOTE — Progress Notes (Signed)
 GYNECOLOGY VIRTUAL VISIT ENCOUNTER NOTE  Provider location: Center for [facility]   Patient location: Home  I connected with Bethany Li on 05/14/24 at 10:35 AM EDT by MyChart Video Encounter and verified that I am speaking with the correct person using two identifiers.   I discussed the limitations, risks, security and privacy concerns of performing an evaluation and management service virtually and the availability of in person appointments. I also discussed with the patient that there may be a patient responsible charge related to this service. The patient expressed understanding and agreed to proceed.   History:  Bethany Li is a 33 y.o. G1P0 female being evaluated today for 0. She denies any abnormal vaginal discharge, bleeding, pelvic pain or other concerns.       Past Medical History:  Diagnosis Date   Anemia    Ankylosing spondylitis (HCC)    Arthritis    Past Surgical History:  Procedure Laterality Date   CESAREAN SECTION     GLAUCOMA SURGERY     The following portions of the patient's history were reviewed and updated as appropriate: allergies, current medications, past family history, past medical history, past social history, past surgical history and problem list.   Health Maintenance:  Normal pap and negative HRHPV on 11/03/22.     Review of Systems:  Pertinent items noted in HPI and remainder of comprehensive ROS otherwise negative.  Physical Exam:   General:  Alert, oriented and cooperative. Patient appears to be in no acute distress.  Mental Status: Normal mood and affect. Normal behavior. Normal judgment and thought content.   Respiratory: Normal respiratory effort, no problems with respiration noted  Rest of physical exam deferred due to type of encounter  Labs and Imaging CLINICAL DATA:  Intermittent pelvic pain   EXAM: TRANSABDOMINAL AND TRANSVAGINAL ULTRASOUND OF PELVIS   TECHNIQUE: Both transabdominal and  transvaginal ultrasound examinations of the pelvis were performed. Transabdominal technique was performed for global imaging of the pelvis including uterus, ovaries, adnexal regions, and pelvic cul-de-sac. It was necessary to proceed with endovaginal exam following the transabdominal exam to visualize the ovaries and endometrium.   COMPARISON:  CT chest abdomen and pelvis 07/23/2020   FINDINGS: Uterus   Measurements: 8.2 x 5.2 x 6.7 cm = volume: 148 mL. Diffusely heterogeneous myometrial echotexture. No focal lesion.   Endometrium   Thickness: 9.4 mm.  Two cystic areas are seen measuring up to 4 mm.   Right ovary   Measurements: 4.3 x 2.4 x 3.4 cm = volume: 18 mL. Normal appearance/no adnexal mass.   Left ovary   Measurements: 3.4 x 2.1 x 4.1 cm = volume: 15.3 mL. Normal appearance/no adnexal mass.   Other findings   No abnormal free fluid.   IMPRESSION: 1. Diffusely heterogeneous myometrial echotexture without focal lesion. This is a nonspecific finding but can be seen in the setting of adenomyosis. 2. Two cystic areas are seen in the endometrium measuring up to 4 mm. This is a nonspecific finding but can be seen in the setting of endometrial hyperplasia. Consider follow-up ultrasound in 6 months to assess for stability.   Assessment and Plan:     1. Pelvic pain in female (Primary) Gyn us  negative, no recurrence of pain F/u in 1 year Pt has normal body habitus and regular menses, believe cystic areas are incidental findings, pt needs annual 03/2025       I discussed the assessment and treatment plan with the patient. The patient was  provided an opportunity to ask questions and all were answered. The patient agreed with the plan and demonstrated an understanding of the instructions.   The patient was advised to call back or seek an in-person evaluation/go to the ED if the symptoms worsen or if the condition fails to improve as anticipated.  I provided 10 minutes  of face-to-face time during this encounter. I also spent 5 minutes dedicated to the care of this patient including pre-visit review of records, post visit ordering of medications and appropriate tests or procedures, coordinating care and documenting this visit encounter.    Jerilynn DELENA Buddle, MD Center for [employer], [facility]

## 2024-05-17 ENCOUNTER — Other Ambulatory Visit (HOSPITAL_COMMUNITY): Payer: Self-pay

## 2024-05-21 ENCOUNTER — Other Ambulatory Visit: Payer: Self-pay | Admitting: Pharmacy Technician

## 2024-05-21 ENCOUNTER — Other Ambulatory Visit: Payer: Self-pay

## 2024-05-21 NOTE — Progress Notes (Signed)
 Specialty Pharmacy Refill Coordination Note  Bethany Li is a 33 y.o. female contacted today regarding refills of specialty medication(s) Certolizumab Pegol  (Cimzia  (2 Syringe))   Patient requested Delivery   Delivery date: 05/23/24   Verified address: [address]   [place] [address]   Medication will be filled on 05/22/24.  Injection date 9/1.

## 2024-05-22 ENCOUNTER — Other Ambulatory Visit: Payer: Self-pay

## 2024-05-22 NOTE — Progress Notes (Signed)
 Patient called back to update delivery information as her complex requires a gate code of [phone] and request that package be delivered to her front door not the leasing office.

## 2024-05-29 ENCOUNTER — Ambulatory Visit: Payer: Medicaid Other | Admitting: Dermatology

## 2024-06-13 ENCOUNTER — Telehealth: Payer: Self-pay | Admitting: *Deleted

## 2024-06-13 ENCOUNTER — Ambulatory Visit

## 2024-06-13 DIAGNOSIS — Z30013 Encounter for initial prescription of injectable contraceptive: Secondary | ICD-10-CM

## 2024-06-13 MED ORDER — MEDROXYPROGESTERONE ACETATE 150 MG/ML IM SUSP
150.0000 mg | Freq: Once | INTRAMUSCULAR | Status: AC
  Administered 2024-06-13: 150 mg via INTRAMUSCULAR

## 2024-06-13 NOTE — Telephone Encounter (Signed)
 Patient advised Flares do not usually cause bruising.   Patient states she is she having a mild uveitis flare- Patient states she was started on prednisolone eye drops and Ketoralac. Patient states she was advised the flare was not bad enough for oral prednisone . Patient plans to follow up with her PCP or urgent care.

## 2024-06-13 NOTE — Progress Notes (Signed)
 Date last pap: 11/03/22. Last Depo-Provera : 03/26/24. Side Effects if any: NA. Serum HCG indicated? NA. Depo-Provera  150 mg IM given by: Duwaine Galla, RN in [facility]. Next appointment due Dec 4-18 2025.

## 2024-06-13 NOTE — Telephone Encounter (Signed)
 Patient has knot in left upper arm, right arm bruised and swelling, right thigh bruised and swelling, right lower leg bruised, right rib bruised, redness, painful to the touch, started, 06/07/2024,  patient inj  05/29/2024 Cimzia  right upper thigh, left abdomin. Patient having flare according [facility] 06/11/2024 Please advise.

## 2024-06-13 NOTE — Telephone Encounter (Signed)
 Flares do not usually cause bruising.   Is she having a uveitis flare-was she started on prednisolone eye drops? Oral prednisone ?

## 2024-06-13 NOTE — Telephone Encounter (Signed)
 Low threshold that this is related to Cimzia  which is subcut injection . She did not administer Cimzia  in the areas where she is having bruising and swelling  Bruising requires time and icing to resolve unfortunately.    Recommend patient see PCP for f/u on bruising since it appears to be unrelated to joints.  Sherry Pennant, PharmD, MPH, BCPS, CPP Clinical Pharmacist [facility])

## 2024-06-14 ENCOUNTER — Other Ambulatory Visit (HOSPITAL_COMMUNITY): Payer: Self-pay

## 2024-06-14 ENCOUNTER — Other Ambulatory Visit: Payer: Self-pay

## 2024-06-14 ENCOUNTER — Telehealth: Payer: Self-pay

## 2024-06-14 NOTE — Progress Notes (Signed)
 Specialty Pharmacy Refill Coordination Note  Spoke with Bethany Li is a 33 y.o. female contacted today regarding refills of specialty medication(s) Certolizumab Pegol  (Cimzia  (2 Syringe))  Doses on hand: 0  Injection date: 06/28/24   Patient requested: Delivery   Delivery date: 06/25/24   Verified address: [address] [place] [address]  Medication will be filled on 06/24/24.

## 2024-06-24 ENCOUNTER — Other Ambulatory Visit: Payer: Self-pay

## 2024-06-24 ENCOUNTER — Telehealth: Admitting: Obstetrics and Gynecology

## 2024-07-15 ENCOUNTER — Other Ambulatory Visit: Payer: Self-pay

## 2024-07-15 ENCOUNTER — Other Ambulatory Visit (HOSPITAL_COMMUNITY): Payer: Self-pay

## 2024-07-15 ENCOUNTER — Other Ambulatory Visit: Payer: Self-pay | Admitting: Physician Assistant

## 2024-07-15 DIAGNOSIS — Z79899 Other long term (current) drug therapy: Secondary | ICD-10-CM

## 2024-07-15 DIAGNOSIS — M450 Ankylosing spondylitis of multiple sites in spine: Secondary | ICD-10-CM

## 2024-07-15 MED ORDER — CIMZIA (2 SYRINGE) 200 MG/ML ~~LOC~~ PSKT
400.0000 mg | PREFILLED_SYRINGE | SUBCUTANEOUS | 0 refills | Status: DC
Start: 2024-07-15 — End: 2024-08-20
  Filled 2024-07-15 – 2024-07-29 (×3): qty 1, 28d supply, fill #0

## 2024-07-15 NOTE — Telephone Encounter (Signed)
 Last Fill: 03/20/2024  Labs: 03/20/2024 CBC and CMP WNL   TB Gold: 10/16/2023 Neg    Next Visit: 08/12/2024  Last Visit: 03/20/2024  DX: Ankylosing spondylitis of multiple sites in spine   Current Dose per office note 03/20/2024: Cimzia  400 mg subcutaneous injections once monthly.   Left message to advise patient she is due to update labs   Okay to refill Cimzia ?

## 2024-07-17 ENCOUNTER — Other Ambulatory Visit: Payer: Self-pay | Admitting: *Deleted

## 2024-07-17 ENCOUNTER — Other Ambulatory Visit (HOSPITAL_COMMUNITY): Payer: Self-pay

## 2024-07-17 ENCOUNTER — Other Ambulatory Visit: Payer: Self-pay

## 2024-07-17 DIAGNOSIS — Z79899 Other long term (current) drug therapy: Secondary | ICD-10-CM

## 2024-07-17 LAB — COMPREHENSIVE METABOLIC PANEL WITH GFR
AG Ratio: 1.6 (calc) (ref 1.0–2.5)
ALT: 20 U/L (ref 6–29)
AST: 20 U/L (ref 10–30)
Albumin: 5 g/dL (ref 3.6–5.1)
Alkaline phosphatase (APISO): 55 U/L (ref 31–125)
BUN/Creatinine Ratio: 11 (calc) (ref 6–22)
BUN: 11 mg/dL (ref 7–25)
CO2: 24 mmol/L (ref 20–32)
Calcium: 10 mg/dL (ref 8.6–10.2)
Chloride: 106 mmol/L (ref 98–110)
Creat: 0.98 mg/dL — ABNORMAL HIGH (ref 0.50–0.97)
Globulin: 3.1 g/dL (ref 1.9–3.7)
Glucose, Bld: 75 mg/dL (ref 65–99)
Potassium: 4.4 mmol/L (ref 3.5–5.3)
Sodium: 140 mmol/L (ref 135–146)
Total Bilirubin: 0.9 mg/dL (ref 0.2–1.2)
Total Protein: 8.1 g/dL (ref 6.1–8.1)
eGFR: 79 mL/min/1.73m2 (ref >=60)

## 2024-07-17 LAB — CBC WITH DIFFERENTIAL/PLATELET
Absolute Lymphocytes: 2584 {cells}/uL (ref 850–3900)
Absolute Monocytes: 591 {cells}/uL (ref 200–950)
Basophils Absolute: 73 {cells}/uL (ref 0–200)
Basophils Relative: 0.9 %
Eosinophils Absolute: 113 {cells}/uL (ref 15–500)
Eosinophils Relative: 1.4 %
HCT: 40.7 % (ref 35.0–45.0)
Hemoglobin: 13.5 g/dL (ref 11.7–15.5)
MCH: 29.2 pg (ref 27.0–33.0)
MCHC: 33.2 g/dL (ref 32.0–36.0)
MCV: 88.1 fL (ref 80.0–100.0)
MPV: 11 fL (ref 7.5–12.5)
Monocytes Relative: 7.3 %
Neutro Abs: 4739 {cells}/uL (ref 1500–7800)
Neutrophils Relative %: 58.5 %
Platelets: 234 Thousand/uL (ref 140–400)
RBC: 4.62 Million/uL (ref 3.80–5.10)
RDW: 13 % (ref 11.0–15.0)
Total Lymphocyte: 31.9 %
WBC: 8.1 Thousand/uL (ref 3.8–10.8)

## 2024-07-18 ENCOUNTER — Ambulatory Visit: Payer: Self-pay | Admitting: Rheumatology

## 2024-07-18 NOTE — Progress Notes (Signed)
 CBC and CMP are normal.

## 2024-07-19 ENCOUNTER — Other Ambulatory Visit: Payer: Self-pay

## 2024-07-29 ENCOUNTER — Other Ambulatory Visit: Payer: Self-pay

## 2024-07-29 ENCOUNTER — Other Ambulatory Visit (HOSPITAL_COMMUNITY): Payer: Self-pay

## 2024-07-29 NOTE — Progress Notes (Signed)
 Specialty Pharmacy Refill Coordination Note  Alease Fait is a 33 y.o. female contacted today regarding refills of specialty medication(s) Certolizumab Pegol  (Cimzia  (2 Syringe))   Patient requested Delivery   Delivery date: 07/31/24   Verified address: [address] [place] [address] GATE CODE [phone]   Medication will be filled on: 07/30/24

## 2024-07-29 NOTE — Progress Notes (Deleted)
 Office Visit Note  Patient: Bethany Li             Date of Birth: 07-Sep-1991           MRN: [id number]             PCP: Catalina Bare, MD Referring: Catalina Bare, MD Visit Date: 08/12/2024 Occupation: Data Unavailable  Subjective:  No chief complaint on file.   History of Present Illness: Bethany Li is a 33 y.o. female ***     Activities of Daily Living:  Patient reports morning stiffness for *** {minute/hour:19697}.   Patient {ACTIONS;DENIES/REPORTS:21021675::Denies} nocturnal pain.  Difficulty dressing/grooming: {ACTIONS;DENIES/REPORTS:21021675::Denies} Difficulty climbing stairs: {ACTIONS;DENIES/REPORTS:21021675::Denies} Difficulty getting out of chair: {ACTIONS;DENIES/REPORTS:21021675::Denies} Difficulty using hands for taps, buttons, cutlery, and/or writing: {ACTIONS;DENIES/REPORTS:21021675::Denies}  No Rheumatology ROS completed.   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    Past Medical History:  Diagnosis Date   Anemia    Ankylosing spondylitis (HCC)    Arthritis     Family History  Problem Relation Age of Onset   Rheum arthritis Father    Past Surgical History:  Procedure Laterality Date   CESAREAN SECTION     GLAUCOMA SURGERY     Social History   Tobacco Use   Smoking status: Never    Passive exposure: Past   Smokeless tobacco: Never  Vaping Use   Vaping status: Former   Substances: Nicotine, Flavoring  Substance Use Topics   Alcohol use: Yes    Comment: occassionally   Drug use: Not Currently   Social History   Social History Narrative   Right Handed    Lives in a two story home      Immunization History  Administered Date(s) Administered   PPD Test 02/26/2024      Objective: Vital Signs: There were no vitals taken for this visit.   Physical Exam   Musculoskeletal Exam: ***  CDAI Exam: CDAI Score: -- Patient Global: --; Provider Global: -- Swollen: --; Tender: -- Joint Exam 08/12/2024   No joint exam has been documented for this visit   There is currently no information documented on the homunculus. Go to the Rheumatology activity and complete the homunculus joint exam.  Investigation: No additional findings.  Imaging: No results found.  Recent Labs: Lab Results  Component Value Date   WBC 8.1 07/17/2024   HGB 13.5 07/17/2024   PLT 234 07/17/2024   NA 140 07/17/2024   K 4.4 07/17/2024   CL 106 07/17/2024   CO2 24 07/17/2024   GLUCOSE 75 07/17/2024   BUN 11 07/17/2024   CREATININE 0.98 (H) 07/17/2024   BILITOT 0.9 07/17/2024   ALKPHOS 47 07/23/2020   AST 20 07/17/2024   ALT 20 07/17/2024   PROT 8.1 07/17/2024   ALBUMIN 4.1 07/23/2020   CALCIUM 10.0 07/17/2024   QFTBGOLDPLUS NEGATIVE 10/16/2023    Speciality Comments: Diagnosed in [place].  Treated with Remicade, Humira and Simponi per patient.    Appointment every 3 months  Procedures:  No procedures performed Allergies: Patient has no known allergies.   Assessment / Plan:     Visit Diagnoses: Ankylosing spondylitis of multiple sites in spine (HCC)  High risk medication use  Elevated LFTs  Panuveitis of both eyes  Pustular psoriasis  Long term (current) use of systemic steroids  Chronic SI joint pain  Myofascial pain  Other fatigue  Plantar fasciitis, bilateral  Orders: No orders of the defined types were placed in this encounter.  No orders of the defined types were placed in this encounter.   Face-to-face time spent with patient was *** minutes. Greater than 50% of time was spent in counseling and coordination of care.  Follow-Up Instructions: No follow-ups on file.   Waddell CHRISTELLA Craze, PA-C  Note - This record has been created using  Dragon software.  Chart creation errors have been sought, but may not always  have been located. Such creation errors do not reflect on  the standard of medical care.

## 2024-08-12 ENCOUNTER — Ambulatory Visit: Admitting: Physician Assistant

## 2024-08-12 DIAGNOSIS — M722 Plantar fascial fibromatosis: Secondary | ICD-10-CM

## 2024-08-12 DIAGNOSIS — Z79899 Other long term (current) drug therapy: Secondary | ICD-10-CM

## 2024-08-12 DIAGNOSIS — R7989 Other specified abnormal findings of blood chemistry: Secondary | ICD-10-CM

## 2024-08-12 DIAGNOSIS — M7918 Myalgia, other site: Secondary | ICD-10-CM

## 2024-08-12 DIAGNOSIS — G8929 Other chronic pain: Secondary | ICD-10-CM

## 2024-08-12 DIAGNOSIS — L401 Generalized pustular psoriasis: Secondary | ICD-10-CM

## 2024-08-12 DIAGNOSIS — M450 Ankylosing spondylitis of multiple sites in spine: Secondary | ICD-10-CM

## 2024-08-12 DIAGNOSIS — R5383 Other fatigue: Secondary | ICD-10-CM

## 2024-08-12 DIAGNOSIS — H44113 Panuveitis, bilateral: Secondary | ICD-10-CM

## 2024-08-12 DIAGNOSIS — Z7952 Long term (current) use of systemic steroids: Secondary | ICD-10-CM

## 2024-08-20 ENCOUNTER — Other Ambulatory Visit: Payer: Self-pay | Admitting: Physician Assistant

## 2024-08-20 ENCOUNTER — Other Ambulatory Visit: Payer: Self-pay

## 2024-08-20 DIAGNOSIS — Z79899 Other long term (current) drug therapy: Secondary | ICD-10-CM

## 2024-08-20 DIAGNOSIS — M450 Ankylosing spondylitis of multiple sites in spine: Secondary | ICD-10-CM

## 2024-08-20 MED ORDER — CIMZIA (2 SYRINGE) 200 MG/ML ~~LOC~~ PSKT
400.0000 mg | PREFILLED_SYRINGE | SUBCUTANEOUS | 2 refills | Status: AC
  Filled 2024-08-20 – 2024-08-23 (×2): qty 2, 28d supply, fill #0
  Filled 2024-09-16: qty 2, 28d supply, fill #1
  Filled 2024-10-17 – 2024-10-30 (×2): qty 2, 28d supply, fill #2

## 2024-08-20 NOTE — Telephone Encounter (Signed)
 Last Fill: 07/15/2024 (30 day supply)  Labs: 07/17/2024 CBC and CMP are normal.   TB Gold: 10/16/2023   Next Visit: 10/21/2024  Last Visit: 03/20/2024  DX:Ankylosing spondylitis of multiple sites in spine   Current Dose per office note 03/20/2024: Cimzia  400 mg subcutaneous injections once monthly.   Okay to refill Cimzia ?

## 2024-08-23 ENCOUNTER — Other Ambulatory Visit: Payer: Self-pay

## 2024-08-23 NOTE — Progress Notes (Signed)
 Specialty Pharmacy Refill Coordination Note  Elleen Coulibaly is a 33 y.o. female contacted today regarding refills of specialty medication(s) Certolizumab Pegol  (Cimzia  (2 Syringe))   Patient requested Delivery   Delivery date: 08/27/24   Verified address: [address] [place] [address] GATE CODE [phone]   Medication will be filled on: 08/26/24

## 2024-08-26 ENCOUNTER — Other Ambulatory Visit: Payer: Self-pay

## 2024-08-29 ENCOUNTER — Ambulatory Visit

## 2024-08-29 VITALS — BP 110/72 | HR 83 | Wt 143.0 lb

## 2024-08-29 DIAGNOSIS — Z3042 Encounter for surveillance of injectable contraceptive: Secondary | ICD-10-CM

## 2024-08-29 MED ORDER — MEDROXYPROGESTERONE ACETATE 150 MG/ML IM SUSP
150.0000 mg | INTRAMUSCULAR | Status: AC
  Administered 2024-08-29: 150 mg via INTRAMUSCULAR

## 2024-08-29 NOTE — Progress Notes (Signed)
 Date last pap: 11/03/22. Last Depo-Provera : 06/13/24. Side Effects if any: N/a  . Serum HCG indicated? N/a. Depo-Provera  150 mg IM given in RUOQ Next appointment due Feb 19- March 5.  .. Administrations This Visit     medroxyPROGESTERone  (DEPO-PROVERA ) injection 150 mg     Admin Date 08/29/2024 Action Given Dose 150 mg Route Intramuscular Documented By Doneta Laymon BIRCH, RN

## 2024-09-16 ENCOUNTER — Other Ambulatory Visit (HOSPITAL_COMMUNITY): Payer: Self-pay

## 2024-09-18 ENCOUNTER — Other Ambulatory Visit: Payer: Self-pay

## 2024-09-18 NOTE — Progress Notes (Signed)
 Specialty Pharmacy Refill Coordination Note  Bethany Li is a 33 y.o. female contacted today regarding refills of specialty medication(s) Certolizumab Pegol  (Cimzia  (2 Syringe))   Patient requested Delivery   Delivery date: 09/24/24   Verified address: [address] [place] [address] GATE CODE [phone]   Medication will be filled on: 09/23/24

## 2024-09-23 ENCOUNTER — Other Ambulatory Visit: Payer: Self-pay

## 2024-09-30 ENCOUNTER — Other Ambulatory Visit: Payer: Self-pay

## 2024-10-02 ENCOUNTER — Encounter: Payer: Self-pay | Admitting: Pharmacist

## 2024-10-04 ENCOUNTER — Other Ambulatory Visit: Payer: Self-pay

## 2024-10-07 NOTE — Progress Notes (Unsigned)
 "  Office Visit Note  Patient: Bethany Li             Date of Birth: 1991-04-17           MRN: [id number]             PCP: Catalina Bare, MD Referring: Catalina Bare, MD Visit Date: 10/21/2024 Occupation: Data Unavailable  Subjective:  No chief complaint on file.   History of Present Illness: Bethany Li is a 34 y.o. female ***     Activities of Daily Living:  Patient reports morning stiffness for *** {minute/hour:19697}.   Patient {ACTIONS;DENIES/REPORTS:21021675::Denies} nocturnal pain.  Difficulty dressing/grooming: {ACTIONS;DENIES/REPORTS:21021675::Denies} Difficulty climbing stairs: {ACTIONS;DENIES/REPORTS:21021675::Denies} Difficulty getting out of chair: {ACTIONS;DENIES/REPORTS:21021675::Denies} Difficulty using hands for taps, buttons, cutlery, and/or writing: {ACTIONS;DENIES/REPORTS:21021675::Denies}  No Rheumatology ROS completed.   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    Past Medical History:  Diagnosis Date   Anemia    Ankylosing spondylitis (HCC)    Arthritis     Family History  Problem Relation Age of Onset   Rheum arthritis Father    Past Surgical History:  Procedure Laterality Date   CESAREAN SECTION     GLAUCOMA SURGERY     Social History[1] Social History   Social History Narrative   Right Handed    Lives in a two story home      Immunization History  Administered Date(s) Administered   PPD Test 02/26/2024     Objective: Vital Signs: There were no vitals taken for this visit.   Physical Exam   Musculoskeletal Exam: ***  CDAI Exam: CDAI Score: -- Patient Global: --; Provider Global: -- Swollen: --; Tender: -- Joint Exam 10/21/2024   No joint exam has been  documented for this visit   There is currently no information documented on the homunculus. Go to the Rheumatology activity and complete the homunculus joint exam.  Investigation: No additional findings.  Imaging: No results found.  Recent Labs: Lab Results  Component Value Date   WBC 8.1 07/17/2024   HGB 13.5 07/17/2024   PLT 234 07/17/2024   NA 140 07/17/2024   K 4.4 07/17/2024   CL 106 07/17/2024   CO2 24 07/17/2024   GLUCOSE 75 07/17/2024   BUN 11 07/17/2024   CREATININE 0.98 (H) 07/17/2024   BILITOT 0.9 07/17/2024   ALKPHOS 47 07/23/2020   AST 20 07/17/2024   ALT 20 07/17/2024   PROT 8.1 07/17/2024   ALBUMIN 4.1 07/23/2020   CALCIUM 10.0 07/17/2024   QFTBGOLDPLUS NEGATIVE 10/16/2023    Speciality Comments: Diagnosed in [place].  Treated with Remicade, Humira and Simponi per patient.    Appointment every 3 months  Procedures:  No procedures performed Allergies: Patient has no known allergies.   Assessment / Plan:     Visit Diagnoses: Ankylosing spondylitis of multiple sites in spine (HCC)  High risk medication use  Elevated LFTs  Panuveitis of both eyes  Pustular psoriasis  Long term (current) use of systemic steroids  Chronic SI joint pain  Myofascial pain  Other fatigue  Plantar fasciitis, bilateral  Orders: No orders of the defined types were placed in this encounter.  No orders of the defined types were placed in this encounter.   Face-to-face time spent with  patient was *** minutes. Greater than 50% of time was spent in counseling and coordination of care.  Follow-Up Instructions: No follow-ups on file.   Waddell CHRISTELLA Craze, PA-C  Note - This record has been created using Dragon software.  Chart creation errors have been sought, but may not always  have been located. Such creation errors do not reflect on  the standard of medical care.     [1]  Social History Tobacco Use   Smoking status: Never    Passive exposure: Past    Smokeless tobacco: Never  Vaping Use   Vaping status: Former   Substances: Nicotine, Flavoring  Substance Use Topics   Alcohol use: Yes    Comment: occassionally   Drug use: Not Currently   "

## 2024-10-17 ENCOUNTER — Other Ambulatory Visit: Payer: Self-pay

## 2024-10-21 ENCOUNTER — Other Ambulatory Visit: Payer: Self-pay

## 2024-10-21 ENCOUNTER — Ambulatory Visit: Admitting: Physician Assistant

## 2024-10-21 DIAGNOSIS — R5383 Other fatigue: Secondary | ICD-10-CM

## 2024-10-21 DIAGNOSIS — Z7952 Long term (current) use of systemic steroids: Secondary | ICD-10-CM

## 2024-10-21 DIAGNOSIS — Z79899 Other long term (current) drug therapy: Secondary | ICD-10-CM

## 2024-10-21 DIAGNOSIS — M450 Ankylosing spondylitis of multiple sites in spine: Secondary | ICD-10-CM

## 2024-10-21 DIAGNOSIS — M722 Plantar fascial fibromatosis: Secondary | ICD-10-CM

## 2024-10-21 DIAGNOSIS — H44113 Panuveitis, bilateral: Secondary | ICD-10-CM

## 2024-10-21 DIAGNOSIS — R7989 Other specified abnormal findings of blood chemistry: Secondary | ICD-10-CM

## 2024-10-21 DIAGNOSIS — G8929 Other chronic pain: Secondary | ICD-10-CM

## 2024-10-21 DIAGNOSIS — M7918 Myalgia, other site: Secondary | ICD-10-CM

## 2024-10-21 DIAGNOSIS — L401 Generalized pustular psoriasis: Secondary | ICD-10-CM

## 2024-10-23 ENCOUNTER — Other Ambulatory Visit (HOSPITAL_COMMUNITY): Payer: Self-pay

## 2024-10-30 ENCOUNTER — Other Ambulatory Visit: Payer: Self-pay

## 2024-10-30 ENCOUNTER — Other Ambulatory Visit: Payer: Self-pay | Admitting: Pharmacist

## 2024-10-30 NOTE — Progress Notes (Signed)
 Specialty Pharmacy Ongoing Clinical Assessment Note  Bethany Li is a 35 y.o. female who is being followed by the specialty pharmacy service for RxSp Ankylosing Spondylitis   Patient's specialty medication(s) reviewed today: Certolizumab Pegol  (Cimzia  (2 Syringe))   Missed doses in the last 4 weeks: 0   Patient/Caregiver did not have any additional questions or concerns.   Therapeutic benefit summary: Patient is achieving benefit   Adverse events/side effects summary: No adverse events/side effects   Patient's therapy is appropriate to: Continue    Goals Addressed             This Visit's Progress    Maintain optimal adherence to therapy   On track    Patient is on track. Patient will maintain adherence and adhere to provider and/or lab appointments         Follow up: 12 months  Lyle LELON Chalk Specialty Pharmacist

## 2024-10-30 NOTE — Progress Notes (Signed)
 Specialty Pharmacy Refill Coordination Note  Bethany Li is a 34 y.o. female contacted today regarding refills of specialty medication(s) Certolizumab Pegol  (Cimzia  (2 Syringe))   Patient requested Delivery   Delivery date: 10/31/24   Verified address: [address] [place] [address] GATE CODE [phone]   Medication will be filled on: 10/30/24

## 2024-10-31 ENCOUNTER — Encounter: Payer: Self-pay | Admitting: Physician Assistant

## 2024-10-31 ENCOUNTER — Telehealth: Payer: Self-pay

## 2024-10-31 ENCOUNTER — Ambulatory Visit: Admitting: Physician Assistant

## 2024-10-31 VITALS — BP 105/70 | HR 88 | Temp 98.6°F | Resp 14 | Ht 65.0 in | Wt 147.2 lb

## 2024-10-31 DIAGNOSIS — Z79899 Other long term (current) drug therapy: Secondary | ICD-10-CM | POA: Diagnosis not present

## 2024-10-31 DIAGNOSIS — Z7952 Long term (current) use of systemic steroids: Secondary | ICD-10-CM | POA: Diagnosis not present

## 2024-10-31 DIAGNOSIS — R5383 Other fatigue: Secondary | ICD-10-CM | POA: Diagnosis not present

## 2024-10-31 DIAGNOSIS — M450 Ankylosing spondylitis of multiple sites in spine: Secondary | ICD-10-CM | POA: Diagnosis not present

## 2024-10-31 DIAGNOSIS — H44113 Panuveitis, bilateral: Secondary | ICD-10-CM | POA: Diagnosis not present

## 2024-10-31 DIAGNOSIS — R7989 Other specified abnormal findings of blood chemistry: Secondary | ICD-10-CM

## 2024-10-31 DIAGNOSIS — G8929 Other chronic pain: Secondary | ICD-10-CM

## 2024-10-31 DIAGNOSIS — M533 Sacrococcygeal disorders, not elsewhere classified: Secondary | ICD-10-CM | POA: Diagnosis not present

## 2024-10-31 DIAGNOSIS — L401 Generalized pustular psoriasis: Secondary | ICD-10-CM

## 2024-10-31 DIAGNOSIS — M7918 Myalgia, other site: Secondary | ICD-10-CM

## 2024-10-31 DIAGNOSIS — M722 Plantar fascial fibromatosis: Secondary | ICD-10-CM | POA: Diagnosis not present

## 2024-10-31 MED ORDER — PREDNISONE 5 MG PO TABS: ORAL_TABLET | ORAL | 0 refills | Status: AC

## 2024-10-31 NOTE — Patient Instructions (Signed)
 Exercises for Inflamed Tissue in the Bottom of the Foot (Plantar Fasciitis) Foot and leg exercises can help if you have inflamed tissue in the bottom of your foot (plantar fasciitis). Only do the exercises you were told to do. Make sure you know how to do the exercises safely. Follow the steps below. It's normal to feel mild discomfort. Stop if you feel pain or if your pain gets worse. Do not start these exercises until told by your health care provider. Your provider will tell you how many times to do these exercises each day. Stretching and range-of-motion exercises These exercises warm up your muscles and joints. They also help with movement and flexibility of your foot. They can help with pain. Plantar fascia stretch This exercise will stretch your plantar fascia, which is a band of thick tissue on the bottom of your foot. Sit with your leg crossed over your other knee. Hold your heel with one hand with that thumb near your arch. With your other hand, hold your toes. Gently pull your toes back toward the top of your foot. You should feel a stretch on the bottom of your toes, on the bottom of your foot, or both. Hold this stretch for as long as you've been told. Slowly let go of your toes. Go back to the starting position.  Gastroc stretch, standing This exercise is called an upper calf, or gastroc, stretch. It stretches the muscles in the back of your upper calf. Stand with your hands against a wall. Extend your leg behind you. Bend your front knee just a little. Keep your heels on the floor, your toes facing forward, and your back knee straight. Shift your weight toward the wall. Do not arch your back. You should feel a gentle stretch in your upper calf. Hold this position for as long as you've been told.  Soleus stretch, standing This exercise is called a lower calf, or soleus, stretch. It stretches the muscles in the back of your lower calf. Stand with your hands against a wall. Extend  your leg behind you. Bend your front knee slightly. Keep your heels on the floor and your toes facing forward. Bend your back knee and shift your weight slightly over your back leg. You should feel a gentle stretch deep in your lower calf. Hold this position for as long as you've been told.  Gastroc and soleus stretch, standing step This exercise stretches the muscles in the back of your lower leg. These include your gastroc and soleus muscles. Stand with the ball of your foot on the front of a step. The ball of your foot is on the walking surface, right under your toes. Keep your other foot firmly on the same step. Hold on to the wall or a railing for balance. Slowly lift your other foot, letting your body weight press your heel down over the edge of the front of the step. Keep your knee straight and unbent. You should feel a stretch in your calf. Hold this position for as long as you've been told. Return both feet to the step. Repeat this exercise with a slight bend in your knee.  Balance exercise This exercise builds your balance and strength control of your arch. It helps take pressure off your plantar fascia. Single leg stand If this exercise is too easy, you can try it with your eyes closed or while standing on a pillow. Without shoes, stand near a railing or in a doorway. You may hold on to the  railing or doorway as needed. Stand on your foot. Keep your big toe down on the floor. Lift the arch of your foot. You should feel a stretch across the bottom of your foot and arch. Do not let your foot roll inward. Hold this position for as long as you've been told.  This information is not intended to replace advice given to you by your health care provider. Make sure you discuss any questions you have with your health care provider. Document Revised: 04/17/2024 Document Reviewed: 04/17/2024 Elsevier Patient Education  2025 [employer].

## 2024-10-31 NOTE — Progress Notes (Signed)
 "  Office Visit Note  Patient: Bethany Li             Date of Birth: 1990-11-23           MRN: [id number]             PCP: Catalina Bare, MD Referring: Catalina Bare, MD Visit Date: 10/31/2024 Occupation: Data Unavailable  Subjective:  Recent flare   History of Present Illness: Bethany Li is a 34 y.o. female with history of ankylosing spondylitis and uveitis.  Patient remains on Cimzia  400 mg sq inj once monthly.  Patient reports that the last 2 doses of Cimzia  she has not been able to administer on time.  She is due for the dose of Cimzia  today and has been in contact to set up the shipment but there has been a delay due to weather.  Patient presents today experiencing exacerbation of pain in both hands especially in both thumbs.  Patient states that her symptoms were about 4 days ago after performing repetitive and strenuous activities while playing in the snow.  Patient states that on Saturday night she noticed swelling in both thumbs.  Patient states she is also experiencing pain due to plantar fasciitis affecting both feet.  She denies any Achilles tendinitis.  Patient experiences intermittent discomfort in the pelvis and SI joints.  Patient is interested in pursuing water aerobics.   Activities of Daily Living:  Patient reports morning stiffness for 1 hour.   Patient Reports nocturnal pain.  Difficulty dressing/grooming: Denies Difficulty climbing stairs: Reports Difficulty getting out of chair: Denies Difficulty using hands for taps, buttons, cutlery, and/or writing: Reports  Review of Systems  Constitutional:  Positive for fatigue.  HENT:  Negative for mouth sores and mouth dryness.   Eyes:  Negative for dryness.  Respiratory:  Negative for shortness of breath.   Cardiovascular:  Negative for chest pain and palpitations.  Gastrointestinal:  Negative for blood in stool, constipation and diarrhea.  Endocrine: Negative for increased urination.   Genitourinary:  Negative for involuntary urination.  Musculoskeletal:  Positive for joint pain, gait problem, joint pain, joint swelling, myalgias, muscle weakness, morning stiffness, muscle tenderness and myalgias.  Skin:  Negative for color change, rash, hair loss and sensitivity to sunlight.  Allergic/Immunologic: Positive for susceptible to infections.  Neurological:  Positive for headaches. Negative for dizziness.  Hematological:  Negative for swollen glands.  Psychiatric/Behavioral:  Positive for sleep disturbance. Negative for depressed mood. The patient is not nervous/anxious.     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    Past Medical History:  Diagnosis Date   Anemia    Ankylosing spondylitis (HCC)    Arthritis     Family History  Problem Relation Age of Onset   Rheum arthritis Father    Past Surgical History:  Procedure Laterality Date   CESAREAN SECTION     GLAUCOMA SURGERY     Social History[1] Social History   Social History Narrative   Right Handed    Lives in a two story home      Immunization History  Administered Date(s) Administered   PPD Test 02/26/2024     Objective: Vital Signs: BP 105/70   Pulse 88   Temp 98.6 F (37 C)  Resp 14   Ht 5' 5 (1.651 m)   Wt 147 lb 3.2 oz (66.8 kg)   BMI 24.50 kg/m    Physical Exam Vitals and nursing note reviewed.  Constitutional:      Appearance: She is well-developed.  HENT:     Head: Normocephalic and atraumatic.  Eyes:     Conjunctiva/sclera: Conjunctivae normal.  Cardiovascular:     Rate and Rhythm: Normal rate and regular rhythm.     Heart sounds: Normal heart sounds.  Pulmonary:     Effort: Pulmonary effort is normal.     Breath sounds: Normal breath  sounds.  Abdominal:     General: Bowel sounds are normal.     Palpations: Abdomen is soft.  Musculoskeletal:     Cervical back: Normal range of motion.  Lymphadenopathy:     Cervical: No cervical adenopathy.  Skin:    General: Skin is warm and dry.     Capillary Refill: Capillary refill takes less than 2 seconds.  Neurological:     Mental Status: She is alert and oriented to person, place, and time.  Psychiatric:        Behavior: Behavior normal.      Musculoskeletal Exam: C-spine, thoracic spine, lumbar spine have good range of motion.  No midline spinal tenderness.  Mild SI joint tenderness.  Shoulder joints, elbow joints, wrist joints, MCPs, PIPs, DIPs have good range of motion with no synovitis.  Tenderness of both CMC joints and bilateral first MCP joints.  Inflammation noted in the right first MCP joint.  Complete fist formation bilaterally.  Hip joints have good range of motion with no groin pain.  Knee joints have good range of motion no warmth or effusion.  Ankle joints have good range of motion no tenderness or joint swelling.  Plantar fasciitis bilaterally   CDAI Exam: CDAI Score: -- Patient Global: --; Provider Global: -- Swollen: --; Tender: -- Joint Exam 10/31/2024   No joint exam has been documented for this visit   There is currently no information documented on the homunculus. Go to the Rheumatology activity and complete the homunculus joint exam.  Investigation: No additional findings.  Imaging: No results found.  Recent Labs: Lab Results  Component Value Date   WBC 8.1 07/17/2024   HGB 13.5 07/17/2024   PLT 234 07/17/2024   NA 140 07/17/2024   K 4.4 07/17/2024   CL 106 07/17/2024   CO2 24 07/17/2024   GLUCOSE 75 07/17/2024   BUN 11 07/17/2024   CREATININE 0.98 (H) 07/17/2024   BILITOT 0.9 07/17/2024   ALKPHOS 47 07/23/2020   AST 20 07/17/2024   ALT 20 07/17/2024   PROT 8.1 07/17/2024   ALBUMIN 4.1 07/23/2020   CALCIUM 10.0 07/17/2024    QFTBGOLDPLUS NEGATIVE 10/16/2023    Speciality Comments: Diagnosed in [place].  Treated with Remicade, Humira and Simponi per patient.    Appointment every 3 months  Procedures:  No procedures performed Allergies: Patient has no known allergies.   Assessment / Plan:     Visit Diagnoses: Ankylosing spondylitis of multiple sites in spine [facility]): Patient presents today experiencing exacerbation of pain and stiffness affecting multiple joints.  For the past 4 days she has had increased pain in both thumbs.  She has tenderness of both CMC joints and tenderness inflammation in the right first MCP joint.  She has had intermittent discomfort in the SI joints as well as plantar fasciitis affecting both feet intermittently.  Patient has requested a referral  to aquatic therapy.  She has remained on Cimzia  400 mg Sq injections once monthly.  She was overdue for the dose of Cimzia  in January and again this month. Alfonso Patterson, LPN provided self injection instructions again today to make the patient feel more comfortable with administration of Cimzia .  All questions were addressed. Patient is expecting a shipment of Cimzia  to arrive tomorrow.  No medication changes will be made at this time.  A prednisone  taper was sent to the pharmacy--starting at 20 mg tapering by 5 mg every 4 days to help alleviate the current symptoms.  She will notify us  if her symptoms persist or worsen.  She will follow-up in the office in 5 months or sooner if needed.  High risk medication use - Cimzia  400 mg sq inj once monthly.Previous tx: Taltz , Rasuvo , Remicade, Humira, and Simponi.Depo-Provera  on 03/27/2024.  Not currently planning pregnancy.  CBC and CMP updated on 07/17/24.  Orders for CBC and CMP released today.   TB gold negative on 10/16/23.  No recent or recurrent infections. Discussed the importance of holding cimzia  if she develops signs or symptoms of an infection and to resume once the infection has completely cleared.    - Plan: CBC with Differential/Platelet, Comprehensive metabolic panel with GFR  Panuveitis of both eyes: Plans to schedule follow-up visit with ophthalmology. No conjunctival injection noted today.  Elevated LFTs -LFTs within normal limits on 07/17/2024.  Plan to check CMP today.  Plan: Comprehensive metabolic panel with GFR  Pustular psoriasis: No pustular psoriasis lesions at this time.  Long term (current) use of systemic steroids: She is not currently taking systemic corticosteroids long-term.  I prednisone  taper was sent to the pharmacy today to alleviate the current flare she is experiencing.  She is aware of the risks of frequent and long-term systemic corticosteroid use.  Chronic SI joint pain: Patient experiences intermittent bouts of discomfort affecting both SI joints.  She has intermittent pelvic pain.  Patient has requested a referral to aquatic therapy--referral will be placed today.   Myofascial pain: Patient experiences intermittent myalgias and muscle tenderness due to myofascial pain.  She will benefit from water therapy--referral will be placed today.   Other fatigue: Chronic, stable.  Plantar fasciitis, bilateral: Patient continues to experience intermittent bouts of plantar fasciitis affecting both feet.  Discussed the importance of wearing proper fitting shoes with arch and heel support.  A handout of exercises were provided in the AVS.  Orders: Orders Placed This Encounter  Procedures   CBC with Differential/Platelet   Comprehensive metabolic panel with GFR   Meds ordered this encounter  Medications   predniSONE  (DELTASONE ) 5 MG tablet    Sig: Take 4 tabs po x 4 days, 3  tabs po x 4 days, 2  tabs po x 4 days, 1  tab po x 4 days    Dispense:  40 tablet    Refill:  0     Follow-Up Instructions: Return in about 5 months (around 03/30/2025).   Waddell CHRISTELLA Craze, PA-C  Note - This record has been created using Dragon software.  Chart creation errors have been  sought, but may not always  have been located. Such creation errors do not reflect on  the standard of medical care.      [1]  Social History Tobacco Use   Smoking status: Never    Passive exposure: Past   Smokeless tobacco: Never  Vaping Use   Vaping status: Former   Substances: Nicotine, Flavoring  Substance Use Topics   Alcohol use: Not Currently    Comment: occassionally   Drug use: Not Currently   "

## 2024-10-31 NOTE — Telephone Encounter (Signed)
-----   Message from Waddell CHRISTELLA Craze sent at 10/31/2024  4:42 PM EST ----- Please place referral to aquatic therapy at [email or web address]  Chronic SI joint pain  Ankylosing spondylitis

## 2024-11-01 ENCOUNTER — Ambulatory Visit: Payer: Self-pay | Admitting: Physician Assistant

## 2024-11-01 LAB — COMPREHENSIVE METABOLIC PANEL WITH GFR
AG Ratio: 1.7 (calc) (ref 1.0–2.5)
ALT: 20 U/L (ref 6–29)
AST: 39 U/L — ABNORMAL HIGH (ref 10–30)
Albumin: 4.7 g/dL (ref 3.6–5.1)
Alkaline phosphatase (APISO): 41 U/L (ref 31–125)
BUN: 10 mg/dL (ref 7–25)
CO2: 23 mmol/L (ref 20–32)
Calcium: 8.9 mg/dL (ref 8.6–10.2)
Chloride: 106 mmol/L (ref 98–110)
Creat: 0.75 mg/dL (ref 0.50–0.97)
Globulin: 2.7 g/dL (ref 1.9–3.7)
Glucose, Bld: 78 mg/dL (ref 65–99)
Potassium: 3.9 mmol/L (ref 3.5–5.3)
Sodium: 137 mmol/L (ref 135–146)
Total Bilirubin: 0.6 mg/dL (ref 0.2–1.2)
Total Protein: 7.4 g/dL (ref 6.1–8.1)
eGFR: 108 mL/min/{1.73_m2}

## 2024-11-01 LAB — CBC WITH DIFFERENTIAL/PLATELET
Absolute Lymphocytes: 2713 {cells}/uL (ref 850–3900)
Absolute Monocytes: 551 {cells}/uL (ref 200–950)
Basophils Absolute: 61 {cells}/uL (ref 0–200)
Basophils Relative: 0.9 %
Eosinophils Absolute: 163 {cells}/uL (ref 15–500)
Eosinophils Relative: 2.4 %
HCT: 36.6 % (ref 35.9–46.0)
Hemoglobin: 12.2 g/dL (ref 11.7–15.5)
MCH: 29.4 pg (ref 27.0–33.0)
MCHC: 33.3 g/dL (ref 31.6–35.4)
MCV: 88.2 fL (ref 81.4–101.7)
MPV: 11.7 fL (ref 7.5–12.5)
Monocytes Relative: 8.1 %
Neutro Abs: 3312 {cells}/uL (ref 1500–7800)
Neutrophils Relative %: 48.7 %
Platelets: 223 10*3/uL (ref 140–400)
RBC: 4.15 Million/uL (ref 3.80–5.10)
RDW: 12.7 % (ref 11.0–15.0)
Total Lymphocyte: 39.9 %
WBC: 6.8 10*3/uL (ref 3.8–10.8)

## 2024-11-01 NOTE — Progress Notes (Signed)
 CBC WNL AST is 39--please clarify if she is taking any tylenol  or alcohol? ALT WNL. Rest of CMP WNL.

## 2024-11-18 ENCOUNTER — Ambulatory Visit

## 2024-12-12 ENCOUNTER — Ambulatory Visit: Admitting: Physician Assistant
